# Patient Record
Sex: Female | Born: 1990
Health system: Southern US, Community
[De-identification: ages and names within clinical notes are randomized; demographics above are authoritative.]

## PROBLEM LIST (undated history)

## (undated) DIAGNOSIS — R42 Dizziness and giddiness: Secondary | ICD-10-CM

## (undated) DIAGNOSIS — L732 Hidradenitis suppurativa: Secondary | ICD-10-CM

## (undated) DIAGNOSIS — K5909 Other constipation: Secondary | ICD-10-CM

## (undated) DIAGNOSIS — S0990XA Unspecified injury of head, initial encounter: Secondary | ICD-10-CM

## (undated) DIAGNOSIS — B009 Herpesviral infection, unspecified: Secondary | ICD-10-CM

## (undated) HISTORY — DX: Unspecified injury of head, initial encounter: S09.90XA

## (undated) HISTORY — DX: Other constipation: K59.09

## (undated) HISTORY — DX: Dizziness and giddiness: R42

---
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
## 2009-06-11 ENCOUNTER — Ambulatory Visit: Payer: Self-pay | Admitting: Internal Medicine

## 2009-06-11 DIAGNOSIS — R42 Dizziness and giddiness: Secondary | ICD-10-CM

## 2009-06-11 HISTORY — DX: Dizziness and giddiness: R42

## 2009-06-15 ENCOUNTER — Ambulatory Visit (HOSPITAL_COMMUNITY): Admission: RE | Admit: 2009-06-15 | Discharge: 2009-06-15 | Payer: Self-pay | Admitting: Internal Medicine

## 2009-06-15 ENCOUNTER — Ambulatory Visit: Payer: Self-pay

## 2009-06-15 ENCOUNTER — Ambulatory Visit: Payer: Self-pay | Admitting: Cardiology

## 2009-08-14 HISTORY — PX: TONSILLECTOMY: SUR1361

## 2009-08-19 ENCOUNTER — Emergency Department (HOSPITAL_COMMUNITY): Admission: EM | Admit: 2009-08-19 | Discharge: 2009-08-19 | Payer: Self-pay | Admitting: Family Medicine

## 2009-08-21 ENCOUNTER — Ambulatory Visit: Payer: Self-pay | Admitting: Internal Medicine

## 2009-08-21 DIAGNOSIS — R21 Rash and other nonspecific skin eruption: Secondary | ICD-10-CM

## 2009-08-25 ENCOUNTER — Encounter: Payer: Self-pay | Admitting: Internal Medicine

## 2009-09-28 ENCOUNTER — Ambulatory Visit: Payer: Self-pay | Admitting: Internal Medicine

## 2010-03-14 LAB — CONVERTED CEMR LAB
ALT: 15 units/L (ref 0–35)
AST: 23 units/L (ref 0–37)
Albumin: 4.4 g/dL (ref 3.5–5.2)
BUN: 6 mg/dL (ref 6–23)
Basophils Absolute: 0.1 10*3/uL (ref 0.0–0.1)
Bilirubin, Direct: 0.2 mg/dL (ref 0.0–0.3)
Chlamydia, Swab/Urine, PCR: NEGATIVE
Cholesterol: 162 mg/dL (ref 0–200)
Creatinine, Ser: 0.7 mg/dL (ref 0.4–1.2)
Eosinophils Absolute: 0.1 10*3/uL (ref 0.0–0.7)
GC Probe Amp, Urine: NEGATIVE
GFR calc non Af Amer: 137.94 mL/min (ref >60)
Glucose, Bld: 80 mg/dL (ref 70–99)
HCT: 40.1 % (ref 36.0–46.0)
Hemoglobin: 13.5 g/dL (ref 12.0–15.0)
LDL Cholesterol: 105 mg/dL — ABNORMAL HIGH (ref 0–99)
Neutro Abs: 3.7 10*3/uL (ref 1.4–7.7)
Nitrite: NEGATIVE
Specific Gravity, Urine: >=1.03 (ref 1.000–1.030)
TSH: 2.97 microintl units/mL (ref 0.35–5.50)
Total Bilirubin: 1.2 mg/dL (ref 0.3–1.2)
Total CHOL/HDL Ratio: 3
Triglycerides: 45 mg/dL (ref 0.0–149.0)
Urobilinogen, UA: 0.2 (ref 0.0–1.0)

## 2010-03-18 NOTE — Op Note (Signed)
Summary: [facility]   Imported By: Sherian Rein 09/08/2009 07:42:34  _____________________________________________________________________  External Attachment:    Type:   Image     Comment:   External Document

## 2010-03-18 NOTE — Letter (Signed)
Summary: [facility]mission Note/[facility]  [facility]    Imported By: Sherian Rein 09/08/2009 07:43:45  _____________________________________________________________________  External Attachment:    Type:   Image     Comment:   External Document

## 2010-03-18 NOTE — Assessment & Plan Note (Signed)
Summary: LIGHTHEADED/#/CD   Vital Signs:  Patient profile:   20 year old female Height:      73 inches Weight:      151.50 pounds BMI:     20.06 O2 Sat:      98 % on Room air Temp:     98.1 degrees F oral Pulse rate:   75 / minute BP sitting:   100 / 62  (left arm) Cuff size:   regular  Vitals Entered ByZella Ball Ewing (June 11, 2009 11:38 AM)  O2 Flow:  Room air  CC: Sarah Randolph for several months/RE   Primary Care Provider:  Corwin Levins MD  CC:  Sarah Randolph for several months/RE.  History of Present Illness: overall doing well, except for recurrent near daily postural lightheadedness , only occurs with getting up quickly from sitting, or raising up quickly from bending at the waist, recurring since aug 2010.  Pt denies CP, sob, doe, wheezing, orthopnea, pnd, worsening LE edema, palps, dizziness or syncope  Pt denies new neuro symptoms such as headache, facial or extremity weakness   No syncope.  Problems Prior to Update: 1)  Sexually Transmitted Disease, Exposure To  (ICD-V01.6) 2)  Dizziness  (ICD-780.4) 3)  Head Trauma, Mild  (ICD-959.01) 4)  Preventive Health Care  (ICD-V70.0)  Medications Prior to Update: 1)  Ibuprofen 200 Mg Tabs (Ibuprofen) .... Take 1-2 By Mouth Once Daily As Needed  Current Medications (verified): 1)  Ibuprofen 200 Mg Tabs (Ibuprofen) .... 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         all otherwise negative per pt -    Physical Exam  General:  alert and well-developed.   Head:  normocephalic and atraumatic.   Eyes:  vision grossly intact, pupils equal, and pupils round.   Ears:  R ear normal and L ear normal.   Nose:  no external deformity and no nasal discharge.   Mouth:  no gingival abnormalities and pharynx pink and moist.   Neck:  supple and no masses.   Lungs:  normal respiratory effort and normal breath sounds.   Heart:  normal rate and regular rhythm.   Abdomen:  soft, non-tender, and normal bowel sounds.   Msk:  no joint tenderness and no joint swelling.   Extremities:  no edema, no erythema  Neurologic:  cranial nerves II-XII intact, strength normal in all extremities, sensation intact to light touch, gait normal, and DTRs symmetrical and normal.   Skin:  color normal and no rashes.   Psych:  not anxious appearing and not depressed appearing.     Impression & Recommendations:  Problem # 1:  PREVENTIVE HEALTH CARE (ICD-V70.0)  Overall doing well, age appropriate education and counseling updated and referral  for appropriate preventive services done unless declined, immunizations up to date or declined, diet counseling done if overweight, urged to quit smoking if smokes , most recent labs reviewed and current ordered if appropriate, ecg reviewed or declined (interpretation per ECG scanned in the EMR if done); information regarding Medicare Prevention requirements given if appropriate   Orders: TLB-BMP (Basic Metabolic Panel-BMET)  (80048-METABOL) TLB-CBC Platelet - w/Differential (85025-CBCD) TLB-Hepatic/Liver Function Pnl (80076-HEPATIC) TLB-Lipid Panel (80061-LIPID) TLB-TSH (Thyroid Stimulating Hormone) (84443-TSH) TLB-Udip ONLY (81003-UDIP)  Problem # 2:  DIZZINESS (ICD-780.4) postura without syncope, but recurrent, no palps or CP,  ECG reviewed - no prolonged QT or other;  will check Echo  Orders: EKG w/ Interpretation (93000) Echo Referral (Echo)  Problem # 3:  SEXUALLY TRANSMITTED DISEASE, EXPOSURE TO (ICD-V01.6) pt requests std eval - denies unprotected intercourse or pelvic pain, d/c  Complete Medication List: 1)  Ibuprofen 200 Mg Tabs (Ibuprofen) .... Take 1-2 by mouth once daily as needed 2)  Depoprovera   Other Orders: T-HIV-1 (Screen) [id number]) T-RPR (Syphilis) [id number]) T-Chlamydia & GC Probe, Urine (87491/[id number]) T-Herpes Simplex Type 2 (95621-30865)  Patient Instructions: 1)  Please go to the Lab in the basement for your blood and/or urine tests today  2)  Your EKG was good today 3)  You will be contacted about the referral(s) to: Echocardiogram 4)  Please schedule a follow-up appointment in 1 year or sooner if needed

## 2010-03-18 NOTE — Assessment & Plan Note (Signed)
Summary: f/u appt/#/cd   Vital Signs:  Patient profile:   20 year old female Height:      74 inches Weight:      140.25 pounds BMI:     18.07 O2 Sat:      97 % on Room air Temp:     97.6 degrees F oral Pulse rate:   76 / minute BP sitting:   100 / 60  (left arm) Cuff size:   regular  Vitals Entered By: Zella Ball Ewing CMA Duncan Dull) (September 28, 2009 9:44 AM)  O2 Flow:  Room air CC: followup/Re   Primary Care Antwan Bribiesca:  Corwin Levins MD  CC:  followup/Re.  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  Pt denies CP, sob, doe, wheezing, orthopnea, pnd, worsening LE edema, palps, dizziness or syncope  Pt denies new neuro symptoms such as headache, facial or extremity weakness   No fever, wt loss, night sweats, loss of appetite or other constitutional symptoms  Problems Prior to Update: 1)  Rash-nonvesicular  (ICD-782.1) 2)  Sexually Transmitted Disease, Exposure To  (ICD-V01.6) 3)  Dizziness  (ICD-780.4) 4)  Head Trauma, Mild  (ICD-959.01) 5)  Preventive Health Care  (ICD-V70.0)  Medications Prior to Update: 1)  Depoprovera 2)  Amoxicillin 250 Mg Caps (Amoxicillin) .Marland Kitchen.. 1 Cap Two Times A Day 3)  Lotrisone 1-0.05 % Crea (Clotrimazole-Betamethasone) .... Use Asd Two Times A Day As Needed  Current Medications (verified): 1)  Depo-Provera 150 Mg/ml Susp (Medroxyprogesterone Acetate) .... 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   Head:  normocephalic and atraumatic.   Eyes:  vision grossly intact, pupils equal, and pupils round.   Ears:  R ear normal and L ear normal.   Nose:  no external deformity and no nasal discharge.   Mouth:  no gingival abnormalities and pharynx pink and moist.   Neck:  supple and no masses.   Lungs:  normal respiratory effort and normal breath sounds.   Heart:  normal rate and regular rhythm.   Abdomen:  soft, non-tender, and normal bowel sounds.   Msk:  no joint tenderness and no joint swelling.   Extremities:  no edema, no erythema  Neurologic:  cranial nerves II-XII intact and strength normal in all extremities.   Skin:  color normal and no rashes.     Impression & Recommendations:  Problem # 1:  RASH-NONVESICULAR (ICD-782.1)  The following medications were removed from the medication list:    Lotrisone 1-0.05 % Crea (Clotrimazole-betamethasone) ..... Use asd two times a day as needed resolved, ok to follow  also, physical form filled out, no charge  Problem # 2:  UNSPECIFIED CONTRACEPTIVE MANAGEMENT (ICD-V25.9) ok for depoprovera 150 mg  - next due in Nov 2011,  then feb 2012, then plans to see GYN when she returns in May 2012 for first pap  Complete Medication List: 1)  Depo-provera 150 Mg/ml Susp (Medroxyprogesterone acetate) .... Use asd im x 1 dose - Mar 31, 2010  Patient Instructions: 1)  Your form was filled out today 2)  You are given the prescriptoin today for the depoprovera for November and February 3)  Please make appt with your GYN in May 2012 for Depo shot and Pap smear 4)  Please schedule a follow-up appointment as needed. Prescriptions: DEPO-PROVERA 150 MG/ML SUSP (MEDROXYPROGESTERONE ACETATE) use asd IM x 1 dose - Mar 31, 2010  #1 x 0   Entered and Authorized by:    Corwin Levins MD   Signed by:   Corwin Levins MD on 09/28/2009   Method used:   Print then Give to Patient   RxID:   [id number] DEPO-PROVERA 150 MG/ML SUSP (MEDROXYPROGESTERONE ACETATE) use asd IM x 1 dose - November15, 2011  #1 x 0   Entered and Authorized by:   Corwin Levins MD   Signed by:   Corwin Levins MD on 09/28/2009   Method used:   Print then Give to Patient   RxID:   [id number]

## 2010-03-18 NOTE — Assessment & Plan Note (Signed)
Summary: BITTEN BY SOMETHING--STC   Vital Signs:  Patient profile:   20 year old female Height:      73 inches Weight:      145 pounds BMI:     19.20 O2 Sat:      99 % on Room air Temp:     98.1 degrees F oral Pulse rate:   63 / minute BP sitting:   102 / 68  (left arm) Cuff size:   regular  Vitals Entered By: Bill Salinas CMA (August 21, 2009 11:52 AM)  O2 Flow:  Room air CC: pt here with c/o some sort of bite on her chest and right shoulder, pt was seen at [facility] and given amox and fluticasone cream but states bites are getting redder and itch quite a bit/ ab   Primary Care Provider:  Corwin Levins MD  CC:  pt here with c/o some sort of bite on her chest and right shoulder and pt was seen at [facility] and given amox and fluticasone cream but states bites are getting redder and itch quite a bit/ ab.  History of Present Illness: here with acute onset rash onset july 5, mild but persistent pruritic and not getting better with amoxil and fluticason cream per urgent care';  has possibly increased spots and more itchy to left upper chest, and 2 small areas right lateral upper arm;  no pain, fever , other rash, tongue swelling or wheezing.    Problems Prior to Update: 1)  Rash-nonvesicular  (ICD-782.1) 2)  Sexually Transmitted Disease, Exposure To  (ICD-V01.6) 3)  Dizziness  (ICD-780.4) 4)  Head Trauma, Mild  (ICD-959.01) 5)  Preventive Health Care  (ICD-V70.0)  Medications Prior to Update: 1)  Ibuprofen 200 Mg Tabs (Ibuprofen) .... Take 1-2 By Mouth Once Daily As Needed 2)  Depoprovera  Current Medications (verified): 1)  Depoprovera 2)  Amoxicillin 250 Mg Caps (Amoxicillin) .Marland Kitchen.. 1 Cap Two Times A Day 3)  Lotrisone 1-0.05 % Crea (Clotrimazole-Betamethasone) .... 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   Head:  normocephalic and atraumatic.   Eyes:  vision grossly intact, pupils equal, and pupils round.   Ears:  R ear normal and L ear normal.   Nose:  no external deformity and no nasal discharge.   Mouth:  no gingival abnormalities and pharynx pink and moist.   Neck:  supple and no masses.   Lungs:  normal respiratory effort and normal breath sounds.   Heart:  normal rate and regular rhythm.   Extremities:  no edema, no erythema  Skin:  1 cm area to left upper chest, and 2 similar to right upper arm erythem spotty lesions nontender nonvesicular   Impression & Recommendations:  Problem # 1:  RASH-NONVESICULAR (ICD-782.1)  Her updated medication list for this problem includes:    Lotrisone 1-0.05 % Crea (Clotrimazole-betamethasone) ..... Use asd two times a day as needed treat as above, f/u any worsening signs or symptoms   Complete Medication List: 1)  Depoprovera  2)  Amoxicillin 250 Mg Caps (Amoxicillin) .Marland KitchenMarland KitchenMarland Kitchen 1  cap two times a day 3)  Lotrisone 1-0.05 % Crea (Clotrimazole-betamethasone) .... Use asd two times a day as needed  Patient Instructions: 1)  stop the fluticasone cream 2)  Please take all new medications as prescribed - the new cream 3)  Continue all previous medications as before this visit 4)  You can also use Caladryl OTC for itching 5)  Good Luck with your tonsils out next wk 6)  Please schedule a follow-up appointment as needed. Prescriptions: LOTRISONE 1-0.05 % CREA (CLOTRIMAZOLE-BETAMETHASONE) use asd two times a day as needed  #1 x 0   Entered and Authorized by:   Corwin Levins MD   Signed by:   Corwin Levins MD on 08/21/2009   Method used:   Print then Give to Patient   RxID:   [id number]

## 2010-03-18 NOTE — Letter (Signed)
[facility] [address] [facility] [address] [facility] [address] [facility] [place]   Imported By: Lester Tolland 09/29/2009 10:48:12  _____________________________________________________________________  External Attachment:    Type:   Image     Comment:   External Document

## 2010-05-06 ENCOUNTER — Ambulatory Visit (INDEPENDENT_AMBULATORY_CARE_PROVIDER_SITE_OTHER): Payer: 59 | Admitting: Endocrinology

## 2010-05-06 ENCOUNTER — Encounter: Payer: Self-pay | Admitting: Endocrinology

## 2010-05-06 VITALS — BP 104/70 | HR 80 | Temp 98.1°F | Ht 73.0 in | Wt 153.6 lb

## 2010-05-06 DIAGNOSIS — J069 Acute upper respiratory infection, unspecified: Secondary | ICD-10-CM | POA: Insufficient documentation

## 2010-05-06 MED ORDER — CEFUROXIME AXETIL 250 MG PO TABS: 250.0000 mg | ORAL_TABLET | Freq: Two times a day (BID) | ORAL | Status: AC

## 2010-05-06 NOTE — Progress Notes (Signed)
  Subjective:    Patient ID: Sarah Randolph, female    DOB: April 18, 1990, 20 y.o.   MRN: [id number]  HPI Pt states few days of congestion in the nose, and assoc sore throat. No earache.  No cough.  lmp was 3 mos ago.  She stopped depo-provera 7 months ago.  She is not sexually active.   Past Medical History  Diagnosis Date  . HEAD TRAUMA, MILD 06/10/2008  . DIZZINESS 06/11/2009  . Chronic constipation    Past Surgical History  Procedure Date  . Tonsillectomy 08/2009    Dr. Edd Fabian    reports that she has never smoked. She does not have any smokeless tobacco history on file. Her alcohol and drug histories not on file. family history includes Hypertension in her other. No Known Allergies   Review of Systems Denies fever and wheezing.      Objective:   Physical Exam Gen: no distress Tm's: are red bilaterally Chest: clear to auscultation.  no respiratory distress head: no deformity eyes: no periorbital swelling, no proptosis external nose and ears are normal mouth: no lesion seen       Assessment & Plan:  Glenford Peers, new problem

## 2010-05-06 NOTE — Patient Instructions (Signed)
i sent a prescription for an antibiotic to your pharmacy. Take loratadine-d (non-prescription) prn for congestion.

## 2010-05-24 LAB — RAPID STREP SCREEN (MED CTR MEBANE ONLY): Streptococcus, Group A Screen (Direct): NEGATIVE

## 2010-06-01 LAB — POCT RAPID STREP A (OFFICE): Streptococcus, Group A Screen (Direct): NEGATIVE

## 2010-09-30 ENCOUNTER — Other Ambulatory Visit: Payer: 59

## 2010-09-30 ENCOUNTER — Telehealth: Payer: Self-pay | Admitting: Internal Medicine

## 2010-09-30 ENCOUNTER — Other Ambulatory Visit (INDEPENDENT_AMBULATORY_CARE_PROVIDER_SITE_OTHER): Payer: 59

## 2010-09-30 DIAGNOSIS — Z0000 Encounter for general adult medical examination without abnormal findings: Secondary | ICD-10-CM

## 2010-09-30 DIAGNOSIS — Z0001 Encounter for general adult medical examination with abnormal findings: Secondary | ICD-10-CM | POA: Insufficient documentation

## 2010-09-30 LAB — CBC WITH DIFFERENTIAL/PLATELET
Eosinophils Absolute: 0.1 10*3/uL (ref 0.0–0.7)
Hemoglobin: 12.8 g/dL (ref 12.0–15.0)
Lymphocytes Relative: 43.1 % (ref 12.0–46.0)
Lymphs Abs: 3.2 10*3/uL (ref 0.7–4.0)
MCHC: 32.8 g/dL (ref 30.0–36.0)
Monocytes Relative: 4.7 % (ref 3.0–12.0)
Neutrophils Relative %: 49.8 % (ref 43.0–77.0)
RBC: 4.92 Mil/uL (ref 3.87–5.11)
RDW: 15.8 % — ABNORMAL HIGH (ref 11.5–14.6)
WBC: 7.4 10*3/uL (ref 4.5–10.5)

## 2010-09-30 LAB — URINALYSIS, ROUTINE W REFLEX MICROSCOPIC
Leukocytes, UA: NEGATIVE
Nitrite: NEGATIVE
Specific Gravity, Urine: >=1.03 (ref 1.000–1.030)
Urine Glucose: NEGATIVE
Urobilinogen, UA: 0.2 (ref 0.0–1.0)
pH: 6 (ref 5.0–8.0)

## 2010-09-30 LAB — BASIC METABOLIC PANEL
Chloride: 102 mEq/L (ref 96–112)
Creatinine, Ser: 0.6 mg/dL (ref 0.4–1.2)
Glucose, Bld: 79 mg/dL (ref 70–99)
Potassium: 3.3 mEq/L — ABNORMAL LOW (ref 3.5–5.1)

## 2010-09-30 LAB — HEPATIC FUNCTION PANEL: AST: 20 U/L (ref 0–37)

## 2010-09-30 LAB — LIPID PANEL
Cholesterol: 153 mg/dL (ref 0–200)
HDL: 60 mg/dL (ref >39.00)
LDL Cholesterol: 86 mg/dL (ref 0–99)
Total CHOL/HDL Ratio: 3

## 2010-09-30 NOTE — Telephone Encounter (Signed)
Message copied by Corwin Levins on Thu Sep 30, 2010 12:36 PM ------      Message from: Scharlene Gloss B      Created: Thu Sep 30, 2010 11:43 AM      Regarding: labs       Labs need an order put in for CPX labs for this patient, has appt. 10/06/2010

## 2010-09-30 NOTE — Telephone Encounter (Signed)
Done per emr 

## 2010-10-06 ENCOUNTER — Other Ambulatory Visit (HOSPITAL_COMMUNITY): Payer: Self-pay | Admitting: Internal Medicine

## 2010-10-06 ENCOUNTER — Ambulatory Visit (INDEPENDENT_AMBULATORY_CARE_PROVIDER_SITE_OTHER): Payer: 59 | Admitting: Internal Medicine

## 2010-10-06 ENCOUNTER — Other Ambulatory Visit (HOSPITAL_COMMUNITY): Payer: 59

## 2010-10-06 ENCOUNTER — Ambulatory Visit (HOSPITAL_COMMUNITY): Payer: 59 | Attending: Internal Medicine | Admitting: Radiology

## 2010-10-06 ENCOUNTER — Encounter: Payer: Self-pay | Admitting: Internal Medicine

## 2010-10-06 VITALS — BP 110/70 | HR 70 | Temp 98.1°F | Ht 74.0 in | Wt 148.0 lb

## 2010-10-06 DIAGNOSIS — R55 Syncope and collapse: Secondary | ICD-10-CM

## 2010-10-06 DIAGNOSIS — I379 Nonrheumatic pulmonary valve disorder, unspecified: Secondary | ICD-10-CM | POA: Insufficient documentation

## 2010-10-06 DIAGNOSIS — I079 Rheumatic tricuspid valve disease, unspecified: Secondary | ICD-10-CM | POA: Insufficient documentation

## 2010-10-06 DIAGNOSIS — Z0000 Encounter for general adult medical examination without abnormal findings: Secondary | ICD-10-CM

## 2010-10-06 DIAGNOSIS — I359 Nonrheumatic aortic valve disorder, unspecified: Secondary | ICD-10-CM | POA: Insufficient documentation

## 2010-10-06 DIAGNOSIS — R42 Dizziness and giddiness: Secondary | ICD-10-CM | POA: Insufficient documentation

## 2010-10-06 NOTE — Assessment & Plan Note (Signed)

## 2010-10-06 NOTE — Assessment & Plan Note (Signed)
By hx prob vasovagal, ecg reviewed - sinus with IRBBB, no acute changes, no prolonged QT; will ask for "stat" echo however given her hx of mult syncope and plays basketball on scholarship; consider card eval but pt declines for now

## 2010-10-06 NOTE — Patient Instructions (Addendum)
Continue all other medications as before Your form is filled out today You will be contacted regarding the referral for: Echocardiogram Please call the phone number [phone] (the PhoneTree System) for results of testing in 2-3 days;  When calling, simply dial the number, and when prompted enter the MRN number above (the Medical Record Number) and the # key, then the message should start.

## 2010-10-06 NOTE — Progress Notes (Signed)
Subjective:    Patient ID: Sarah Randolph, female    DOB: 10-15-1990, 20 y.o.   MRN: [id number]  HPI Here for wellness and f/u;  Overall doing ok;  Pt denies CP, worsening SOB, DOE, wheezing, orthopnea, PND, worsening LE edema.  Pt denies neurological change such as new Headache, facial or extremity weakness.  Pt denies polydipsia, polyuria, or low sugar symptoms. Pt states overall good compliance with treatment and medications, good tolerability, and trying to follow lower cholesterol diet.  Pt denies worsening depressive symptoms, suicidal ideation or panic. No fever, wt loss, night sweats, loss of appetite, or other constitutional symptoms.  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 No other prodromal symtpoms, and Pt denies chest pain, increased sob or doe, wheezing, orthopnea, PND, increased LE swelling, palpitations.    Pt denies fever, wt loss, night sweats, loss of appetite, or other constitutional symptoms No recent overt or unusual bruising or bleeding.  States have passed out in similar fashion in the past, the last time over 1 yr, and total episodes of syncope have been at least 6-10 by her count.     Past Medical History  Diagnosis Date  .  HEAD TRAUMA, MILD 06/10/2008  . DIZZINESS 06/11/2009  . Chronic constipation    Past Surgical History  Procedure Date  . Tonsillectomy 08/2009    Dr. Edd Fabian    reports that she has never smoked. She does not have any smokeless tobacco history on file. Her alcohol and drug histories not on file. family history includes Hypertension in her other. No Known Allergies No current outpatient prescriptions on file prior to visit.    Review of Systems Review of Systems  Constitutional: Negative for diaphoresis and unexpected weight change.  HENT: Negative for drooling and tinnitus.   Eyes: Negative for photophobia and visual disturbance.  Respiratory: Negative for choking and stridor.   Gastrointestinal: Negative for vomiting and blood in stool.  Genitourinary: Negative for hematuria and decreased urine volume.  Musculoskeletal: Negative for gait problem.  Skin: Negative for color change and wound.  Neurological: Negative for tremors and numbness.  Psychiatric/Behavioral: Negative for decreased concentration. The patient is not hyperactive.       Objective:   Physical Exam BP 110/70  Pulse 70  Temp(Src) 98.1 F (36.7 C) (Oral)  Ht 6\' 2"  (1.88 m)  Wt 148 lb (67.132 kg)  BMI 19.00 kg/m2  SpO2 97% Physical Exam  VS noted Constitutional: Pt appears well-developed and well-nourished.  HENT: Head: Normocephalic.  Right Ear: External ear normal.  Left Ear: External ear normal.  Eyes: Conjunctivae and EOM are normal. Pupils are equal, round, and reactive to light.  Neck:  Normal range of motion. Neck supple.  Cardiovascular: Normal rate and regular rhythm.   Pulmonary/Chest: Effort normal and breath sounds normal.  Abd:  Soft, NT, non-distended, + BS Neurological: Pt is alert. No cranial nerve deficit. motor/sens/dtr/gait normal Skin: Skin is warm. No erythema.  Psychiatric: Pt behavior is normal. Thought content normal.     Assessment & Plan:

## 2010-10-07 ENCOUNTER — Other Ambulatory Visit (HOSPITAL_COMMUNITY): Payer: 59

## 2010-11-08 ENCOUNTER — Ambulatory Visit: Payer: 59 | Admitting: Internal Medicine

## 2010-11-08 DIAGNOSIS — Z0289 Encounter for other administrative examinations: Secondary | ICD-10-CM

## 2011-08-11 ENCOUNTER — Encounter: Payer: Self-pay | Admitting: Internal Medicine

## 2011-08-11 ENCOUNTER — Ambulatory Visit (INDEPENDENT_AMBULATORY_CARE_PROVIDER_SITE_OTHER)
Admission: RE | Admit: 2011-08-11 | Discharge: 2011-08-11 | Disposition: A | Discharge status: Home / Self Care | Payer: 59 | Source: Ambulatory Visit | Attending: Internal Medicine | Admitting: Internal Medicine

## 2011-08-11 ENCOUNTER — Other Ambulatory Visit (INDEPENDENT_AMBULATORY_CARE_PROVIDER_SITE_OTHER): Payer: 59

## 2011-08-11 ENCOUNTER — Ambulatory Visit (INDEPENDENT_AMBULATORY_CARE_PROVIDER_SITE_OTHER): Payer: 59 | Admitting: Internal Medicine

## 2011-08-11 VITALS — BP 94/60 | HR 59 | Temp 98.4°F | Ht 73.0 in | Wt 162.0 lb

## 2011-08-11 DIAGNOSIS — R2230 Localized swelling, mass and lump, unspecified upper limb: Secondary | ICD-10-CM | POA: Insufficient documentation

## 2011-08-11 DIAGNOSIS — Z0000 Encounter for general adult medical examination without abnormal findings: Secondary | ICD-10-CM

## 2011-08-11 DIAGNOSIS — R229 Localized swelling, mass and lump, unspecified: Secondary | ICD-10-CM

## 2011-08-11 LAB — LIPID PANEL
HDL: 69.1 mg/dL (ref >39.00)
Total CHOL/HDL Ratio: 3

## 2011-08-11 LAB — BASIC METABOLIC PANEL
BUN: 13 mg/dL (ref 6–23)
CO2: 28 mEq/L (ref 19–32)
Calcium: 9.5 mg/dL (ref 8.4–10.5)
Chloride: 104 mEq/L (ref 96–112)
Creatinine, Ser: 0.7 mg/dL (ref 0.4–1.2)
GFR: 126.63 mL/min (ref >60.00)
Glucose, Bld: 88 mg/dL (ref 70–99)
Sodium: 139 mEq/L (ref 135–145)

## 2011-08-11 LAB — URINALYSIS, ROUTINE W REFLEX MICROSCOPIC
Bilirubin Urine: NEGATIVE
Ketones, ur: NEGATIVE
Nitrite: NEGATIVE
Specific Gravity, Urine: 1.015 (ref 1.000–1.030)
Total Protein, Urine: NEGATIVE
pH: 7 (ref 5.0–8.0)

## 2011-08-11 LAB — CBC WITH DIFFERENTIAL/PLATELET
Basophils Relative: 0.8 % (ref 0.0–3.0)
Eosinophils Relative: 2.9 % (ref 0.0–5.0)
HCT: 41.2 % (ref 36.0–46.0)
Hemoglobin: 13.4 g/dL (ref 12.0–15.0)
Lymphs Abs: 2.5 10*3/uL (ref 0.7–4.0)
MCV: 80.5 fl (ref 78.0–100.0)
Monocytes Absolute: 0.5 10*3/uL (ref 0.1–1.0)
RBC: 5.12 Mil/uL — ABNORMAL HIGH (ref 3.87–5.11)
WBC: 7.1 10*3/uL (ref 4.5–10.5)

## 2011-08-11 LAB — HEPATIC FUNCTION PANEL: Total Bilirubin: 0.9 mg/dL (ref 0.3–1.2)

## 2011-08-11 MED ORDER — AZITHROMYCIN 250 MG PO TABS: ORAL_TABLET | ORAL | Status: AC

## 2011-08-11 NOTE — Patient Instructions (Addendum)
Take all new medications as prescribed  - the antibiotic Please go to XRAY in the Basement for the x-ray test Please go to LAB in the Basement for the blood and/or urine tests to be done today You will be contacted by phone if any changes need to be made immediately.  Otherwise, you will receive a letter about your results with an explanation. Please return if the lump becomes larger Your form was filled out today Please return in 1 year for your yearly visit, or as needed

## 2011-08-12 ENCOUNTER — Encounter: Payer: Self-pay | Admitting: Internal Medicine

## 2011-08-13 ENCOUNTER — Encounter: Payer: Self-pay | Admitting: Internal Medicine

## 2011-08-13 NOTE — Assessment & Plan Note (Signed)

## 2011-08-13 NOTE — Assessment & Plan Note (Addendum)
Unclear etiology, ? Infect cyst vs LA - for antibx course,  to f/u any worsening symptoms or concerns, for cxr but suspect localized issue, consider soft tissue u/s if persists or worsens

## 2011-08-13 NOTE — Progress Notes (Signed)
Subjective:    Patient ID: Sarah Randolph, female    DOB: 1990-12-15, 21 y.o.   MRN: [id number]  HPI  Here for wellness and f/u;  Overall doing ok;  Pt denies CP, worsening SOB, DOE, wheezing, orthopnea, PND, worsening LE edema, palpitations, dizziness or syncope.  Pt denies neurological change such as new Headache, facial or extremity weakness.  Pt denies polydipsia, polyuria, or low sugar symptoms. Pt states overall good compliance with treatment and medications, good tolerability, and trying to follow lower cholesterol diet.  Pt denies worsening depressive symptoms, suicidal ideation or panic. No fever, wt loss, night sweats, loss of appetite, or other constitutional symptoms.  Pt states good ability with ADL's, low fall risk, home safety reviewed and adequate, no significant changes in hearing or vision, and occasionally active with exercise.  Does have a lump to left axilla, mild tender, red, swelling with increased warmth and sweats in the last 3 days. Past Medical History  Diagnosis Date  . HEAD TRAUMA, MILD 06/10/2008  . DIZZINESS 06/11/2009  . Chronic constipation    Past Surgical History  Procedure Date  . Tonsillectomy 08/2009    Dr. Edd Fabian    reports that she has never smoked. She does not have any smokeless tobacco history on file. Her alcohol and drug histories not on file. family history includes Hypertension in her other. No Known Allergies No current outpatient prescriptions on file prior to visit.   Review of Systems Review of Systems  Constitutional: Negative for diaphoresis, activity change, appetite change and unexpected weight change.  HENT: Negative for hearing loss, ear pain, facial swelling, mouth sores and neck stiffness.   Eyes: Negative for pain, redness and visual disturbance.  Respiratory: Negative for shortness of breath and wheezing.   Cardiovascular: Negative for chest pain and palpitations.  Gastrointestinal: Negative for diarrhea, blood in stool,  abdominal distention and rectal pain.  Genitourinary: Negative for hematuria, flank pain and decreased urine volume.  Musculoskeletal: Negative for myalgias and joint swelling.  Skin: Negative for color change and wound.  Neurological: Negative for syncope and numbness.  Hematological: Negative for adenopathy.  Psychiatric/Behavioral: Negative for hallucinations, self-injury, decreased concentration and agitation.     Objective:   Physical Exam BP 94/60  Pulse 59  Temp 98.4 F (36.9 C) (Oral)  Ht 6\' 1"  (1.854 m)  Wt 162 lb (73.483 kg)  BMI 21.37 kg/m2  SpO2 98%  LMP 08/11/2011 Physical Exam  VS noted Constitutional: Pt is oriented to person, place, and time. Appears well-developed and well-nourished.  Head: Normocephalic and atraumatic.  Right Ear: External ear normal.  Left Ear: External ear normal.  Nose: Nose normal.  Mouth/Throat: Oropharynx is clear and moist.  Eyes: Conjunctivae and EOM are normal. Pupils are equal, round, and reactive to light.  Neck: Normal range of motion. Neck supple. No JVD present. No tracheal deviation present.  Cardiovascular: Normal rate, regular rhythm, normal heart sounds and intact distal pulses.   Pulmonary/Chest: Effort normal and breath sounds normal.  Abdominal: Soft. Bowel sounds are normal. There is no tenderness.  Musculoskeletal: Normal range of motion. Exhibits no edema.  Lymphadenopathy:  Has no cervical adenopathy.  Neurological: Pt is alert and oriented to person, place, and time. Pt has normal reflexes. No cranial nerve deficit.  Skin: Skin is warm and dry. No rash noted. Left axilla with approx < 1.0 cm small tender lump subq without fluctuance or drainage Psychiatric:  Has  normal mood and affect. Behavior is normal.  Assessment & Plan:

## 2011-10-11 ENCOUNTER — Encounter: Payer: 59 | Admitting: Internal Medicine

## 2012-10-02 ENCOUNTER — Emergency Department (HOSPITAL_BASED_OUTPATIENT_CLINIC_OR_DEPARTMENT_OTHER)
Admission: EM | Admit: 2012-10-02 | Discharge: 2012-10-02 | Disposition: A | Discharge status: Home / Self Care | Payer: Self-pay | Attending: Emergency Medicine | Admitting: Emergency Medicine

## 2012-10-02 ENCOUNTER — Encounter (HOSPITAL_BASED_OUTPATIENT_CLINIC_OR_DEPARTMENT_OTHER): Payer: Self-pay | Admitting: *Deleted

## 2012-10-02 DIAGNOSIS — M25569 Pain in unspecified knee: Secondary | ICD-10-CM | POA: Insufficient documentation

## 2012-10-02 DIAGNOSIS — M25469 Effusion, unspecified knee: Secondary | ICD-10-CM | POA: Insufficient documentation

## 2012-10-02 DIAGNOSIS — R52 Pain, unspecified: Secondary | ICD-10-CM | POA: Insufficient documentation

## 2012-10-02 DIAGNOSIS — Z8719 Personal history of other diseases of the digestive system: Secondary | ICD-10-CM | POA: Insufficient documentation

## 2012-10-02 DIAGNOSIS — M25562 Pain in left knee: Secondary | ICD-10-CM

## 2012-10-02 DIAGNOSIS — Z87828 Personal history of other (healed) physical injury and trauma: Secondary | ICD-10-CM | POA: Insufficient documentation

## 2012-10-02 NOTE — ED Notes (Signed)
About a month ago pt was playing basketball and her knee "gave out", since then it has been tender.

## 2012-10-02 NOTE — ED Provider Notes (Signed)
CSN: [id number]     Arrival date & time 10/02/12  1924 History     First MD Initiated Contact with Patient 10/02/12 1934     Chief Complaint  Patient presents with  . Knee Injury   (Consider location/radiation/quality/duration/timing/severity/associated sxs/prior Treatment) HPI Comments: Patient is a 22 year old female who presents today with left knee pain since one month ago. She was playing basketball and jumped up to block a shot and came down on her knee wrong. Since that time her knee has been intermittently swelling and she is developed a sharp pain on the medial aspect of her left knee as well as posteriorly. She currently has full range of motion and no difficulty walking and her knee. She has difficulty squatting down. She would like an MRI today. No fevers, chills, nausea, vomiting, abdominal pain, numbness, weakness, paresthesias.  The history is provided by the patient. No language interpreter was used.    Past Medical History  Diagnosis Date  . HEAD TRAUMA, MILD 06/10/2008  . DIZZINESS 06/11/2009  . Chronic constipation    Past Surgical History  Procedure Laterality Date  . Tonsillectomy  08/2009    Dr. Edd Fabian   Family History  Problem Relation Age of Onset  . Hypertension Other    History  Substance Use Topics  . Smoking status: Never Smoker   . Smokeless tobacco: Not on file  . Alcohol Use: Not on file   OB History   Grav Para Term Preterm Abortions TAB SAB Ect Mult Living                 Review of Systems  Constitutional: Negative for fever and chills.  Respiratory: Negative for shortness of breath.   Cardiovascular: Negative for chest pain.  Musculoskeletal: Positive for joint swelling and arthralgias.  All other systems reviewed and are negative.    Allergies  Review of patient's allergies indicates no known allergies.  Home Medications  No current outpatient prescriptions on file. BP 122/60  Pulse 68  Temp(Src) 98.5 F (36.9 C) (Oral)   Resp 16  Ht 6\' 2"  (1.88 m)  Wt 151 lb (68.493 kg)  BMI 19.38 kg/m2  SpO2 100% Physical Exam  Nursing note and vitals reviewed. Constitutional: She is oriented to person, place, and time. She appears well-developed and well-nourished. No distress.  HENT:  Head: Normocephalic and atraumatic.  Right Ear: External ear normal.  Left Ear: External ear normal.  Nose: Nose normal.  Mouth/Throat: Oropharynx is clear and moist.  Eyes: Conjunctivae are normal.  Neck: Normal range of motion.  Cardiovascular: Normal rate, regular rhythm, normal heart sounds, intact distal pulses and normal pulses.   Pulmonary/Chest: Effort normal and breath sounds normal. No stridor. No respiratory distress. She has no wheezes. She has no rales.  Abdominal: Soft. She exhibits no distension.  Musculoskeletal: Normal range of motion.  ttp on medial joint line and posteriorly on left knee. Joint stable. Full range of motion of left knee. Neurovascularly intact. Compartment soft.   Neurological: She is alert and oriented to person, place, and time. She has normal strength.  Skin: Skin is warm and dry. She is not diaphoretic. No erythema.  Psychiatric: She has a normal mood and affect. Her behavior is normal.    ED Course   Procedures (including critical care time)  Labs Reviewed - No data to display No results found. 1. Knee pain, acute, left     MDM  Patient presents with left knee pain since an injury one  month ago. She has been ambulatory since that time. Gait is not antalgic. There is a small amount of swelling at this time. The joint is stable. Neurovascularly intact. Compartments soft. She is given a knee sleeve for comfort. Follow up with ortho. Return instructions given. Vital signs stable for discharge. Patient / Family / Caregiver informed of clinical course, understand medical decision-making process, and agree with plan.   Mora Bellman, PA-C 10/02/12 [phone]

## 2012-10-03 NOTE — ED Provider Notes (Signed)
Medical screening examination/treatment/procedure(s) were performed by non-physician practitioner and as supervising physician I was immediately available for consultation/collaboration.   Gwyneth Sprout, MD 10/03/12 [phone]

## 2012-12-10 ENCOUNTER — Emergency Department (HOSPITAL_COMMUNITY): Payer: BC Managed Care – PPO

## 2012-12-10 ENCOUNTER — Encounter (HOSPITAL_COMMUNITY): Payer: Self-pay | Admitting: Emergency Medicine

## 2012-12-10 ENCOUNTER — Emergency Department (HOSPITAL_COMMUNITY)
Admission: EM | Admit: 2012-12-10 | Discharge: 2012-12-10 | Disposition: A | Discharge status: Home / Self Care | Payer: Self-pay | Attending: Emergency Medicine | Admitting: Emergency Medicine

## 2012-12-10 DIAGNOSIS — R1110 Vomiting, unspecified: Secondary | ICD-10-CM | POA: Insufficient documentation

## 2012-12-10 DIAGNOSIS — F1010 Alcohol abuse, uncomplicated: Secondary | ICD-10-CM | POA: Insufficient documentation

## 2012-12-10 DIAGNOSIS — Z87828 Personal history of other (healed) physical injury and trauma: Secondary | ICD-10-CM | POA: Insufficient documentation

## 2012-12-10 DIAGNOSIS — F10929 Alcohol use, unspecified with intoxication, unspecified: Secondary | ICD-10-CM

## 2012-12-10 DIAGNOSIS — Z8719 Personal history of other diseases of the digestive system: Secondary | ICD-10-CM | POA: Insufficient documentation

## 2012-12-10 DIAGNOSIS — Z3202 Encounter for pregnancy test, result negative: Secondary | ICD-10-CM | POA: Insufficient documentation

## 2012-12-10 DIAGNOSIS — R4182 Altered mental status, unspecified: Secondary | ICD-10-CM | POA: Insufficient documentation

## 2012-12-10 LAB — RAPID URINE DRUG SCREEN, HOSP PERFORMED
Amphetamines: NOT DETECTED
Benzodiazepines: NOT DETECTED

## 2012-12-10 LAB — POCT PREGNANCY, URINE: Preg Test, Ur: NEGATIVE

## 2012-12-10 LAB — ETHANOL: Alcohol, Ethyl (B): 257 mg/dL — ABNORMAL HIGH (ref 0–11)

## 2012-12-10 MED ORDER — SODIUM CHLORIDE 0.9 % IV BOLUS (SEPSIS)
1000.0000 mL | Freq: Once | INTRAVENOUS | Status: AC
  Administered 2012-12-10: 1000 mL via INTRAVENOUS

## 2012-12-10 MED ORDER — PANTOPRAZOLE SODIUM 40 MG IV SOLR
40.0000 mg | Freq: Once | INTRAVENOUS | Status: AC
  Administered 2012-12-10: 40 mg via INTRAVENOUS
  Filled 2012-12-10: qty 40

## 2012-12-10 MED ORDER — ONDANSETRON HCL 4 MG/2ML IJ SOLN
4.0000 mg | Freq: Once | INTRAMUSCULAR | Status: AC
  Administered 2012-12-10: 4 mg via INTRAVENOUS
  Filled 2012-12-10: qty 2
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
## 2012-12-13 ENCOUNTER — Ambulatory Visit: Payer: 59 | Admitting: Internal Medicine

## 2013-01-17 ENCOUNTER — Other Ambulatory Visit: Payer: BC Managed Care – PPO

## 2013-01-17 ENCOUNTER — Ambulatory Visit (INDEPENDENT_AMBULATORY_CARE_PROVIDER_SITE_OTHER): Payer: BC Managed Care – PPO | Admitting: Internal Medicine

## 2013-01-17 ENCOUNTER — Encounter: Payer: Self-pay | Admitting: Internal Medicine

## 2013-01-17 VITALS — BP 100/68 | HR 73 | Temp 98.6°F | Wt 154.1 lb

## 2013-01-17 DIAGNOSIS — R21 Rash and other nonspecific skin eruption: Secondary | ICD-10-CM

## 2013-01-17 DIAGNOSIS — L309 Dermatitis, unspecified: Secondary | ICD-10-CM

## 2013-01-17 DIAGNOSIS — L259 Unspecified contact dermatitis, unspecified cause: Secondary | ICD-10-CM

## 2013-01-17 MED ORDER — DIPHENHYDRAMINE HCL 25 MG PO TABS: 25.0000 mg | ORAL_TABLET | Freq: Four times a day (QID) | ORAL | Status: DC | PRN

## 2013-01-17 MED ORDER — TRIAMCINOLONE ACETONIDE 0.1 % EX LOTN: 1.0000 "application " | TOPICAL_LOTION | Freq: Two times a day (BID) | CUTANEOUS | Status: DC

## 2013-01-17 MED ORDER — METHYLPREDNISOLONE ACETATE 80 MG/ML IJ SUSP
80.0000 mg | Freq: Once | INTRAMUSCULAR | Status: AC
  Administered 2013-01-17: 80 mg via INTRAMUSCULAR

## 2013-01-17 NOTE — Patient Instructions (Addendum)
It was good to see you today.  We have reviewed your prior records including labs and tests today  Medrol 80mg  Steroid shot given to you for allergic reaction today  Prescription lotion to use on the affected skin as needed. Also over-the-counter Benadryl every 6 hours as needed for itch  Your prescription(s) have been submitted to your pharmacy. Please take as directed and contact our office if you believe you are having problem(s) with the medication(s).  Test(s) ordered today to check for food allergies. Your results will be released to MyChart (or called to you) after review, usually within 72hours after test completion. If any changes need to be made, you will be notified at that same time.   Eczema Atopic dermatitis, or eczema, is an inherited type of sensitive skin. Often people with eczema have a family history of allergies, asthma, or hay fever. It causes a red itchy rash and dry scaly skin. The itchiness may occur before the skin rash and may be very intense. It is not contagious. Eczema is generally worse during the cooler winter months and often improves with the warmth of summer. Eczema usually starts showing signs in infancy. Some children outgrow eczema, but it may last through adulthood. Flare-ups may be caused by:  Eating something or contact with something you are sensitive or allergic to.  Stress. DIAGNOSIS  The diagnosis of eczema is usually based upon symptoms and medical history. TREATMENT  Eczema cannot be cured, but symptoms usually can be controlled with treatment or avoidance of allergens (things to which you are sensitive or allergic to).  Controlling the itching and scratching.  Use over-the-counter antihistamines as directed for itching. It is especially useful at night when the itching tends to be worse.  Use over-the-counter steroid creams as directed for itching.  Scratching makes the rash and itching worse and may cause impetigo (a skin infection) if  fingernails are contaminated (dirty).  Keeping the skin well moisturized with creams every day. This will seal in moisture and help prevent dryness. Lotions containing alcohol and water can dry the skin and are not recommended.  Limiting exposure to allergens.  Recognizing situations that cause stress.  Developing a plan to manage stress. HOME CARE INSTRUCTIONS   Take prescription and over-the-counter medicines as directed by your caregiver.  Do not use anything on the skin without checking with your caregiver.  Keep baths or showers short (5 minutes) in warm (not hot) water. Use mild cleansers for bathing. You may add non-perfumed bath oil to the bath water. It is best to avoid soap and bubble bath.  Immediately after a bath or shower, when the skin is still damp, apply a moisturizing ointment to the entire body. This ointment should be a petroleum ointment. This will seal in moisture and help prevent dryness. The thicker the ointment the better. These should be unscented.  Keep fingernails cut short and wash hands often. If your child has eczema, it may be necessary to put soft gloves or mittens on your child at night.  Dress in clothes made of cotton or cotton blends. Dress lightly, as heat increases itching.  Avoid foods that may cause flare-ups. Common foods include cow's milk, peanut butter, eggs and wheat.  Keep a child with eczema away from anyone with fever blisters. The virus that causes fever blisters (herpes simplex) can cause a serious skin infection in children with eczema. SEEK MEDICAL CARE IF:   Itching interferes with sleep.  The rash gets worse or is  not better within one week following treatment.  The rash looks infected (pus or soft yellow scabs).  You or your child has an oral temperature above 102 F (38.9 C).  Your baby is older than 3 months with a rectal temperature of 100.5 F (38.1 C) or higher for more than 1 day.  The rash flares up after contact  with someone who has fever blisters. SEEK IMMEDIATE MEDICAL CARE IF:   Your baby is older than 3 months with a rectal temperature of 102 F (38.9 C) or higher.  Your baby is older than 3 months or younger with a rectal temperature of 100.4 F (38 C) or higher. Document Released: 01/29/2000 Document Revised: 04/25/2011 Document Reviewed: 09/03/2012 [facility] Patient Information 2014 [place], [place].

## 2013-01-17 NOTE — Progress Notes (Signed)
Pre-visit discussion using our clinic review tool. No additional management support is needed unless otherwise documented below in the visit note.  

## 2013-01-17 NOTE — Progress Notes (Signed)
   Subjective:    Patient ID: Sarah Randolph, female    DOB: [birth year], 22 y.o.   MRN: [id number]  Rash This is a new problem. The current episode started in the past 7 days. The problem has been waxing and waning since onset. The affected locations include the left shoulder, neck, back, right wrist, right hand and left wrist. The rash is characterized by dryness, itchiness and redness. She was exposed to a new detergent/soap and shellfish. Pertinent negatives include no congestion, cough, diarrhea, eye pain, fatigue, fever, joint pain, nail changes, rhinorrhea, shortness of breath, sore throat or vomiting. Past treatments include nothing. Her past medical history is significant for eczema. There is no history of allergies or asthma.   Past Medical History  Diagnosis Date  . HEAD TRAUMA, MILD 06/10/2008  . DIZZINESS 06/11/2009  . Chronic constipation     Review of Systems  Constitutional: Negative for fever and fatigue.  HENT: Negative for congestion, rhinorrhea and sore throat.   Eyes: Negative for pain.  Respiratory: Negative for cough and shortness of breath.   Gastrointestinal: Negative for vomiting and diarrhea.  Musculoskeletal: Negative for joint pain.  Skin: Positive for rash. Negative for nail changes.       Objective:   Physical Exam BP 100/68  Pulse 73  Temp(Src) 98.6 F (37 C) (Oral)  Wt 154 lb 1.9 oz (69.908 kg)  SpO2 99% Wt Readings from Last 3 Encounters:  01/17/13 154 lb 1.9 oz (69.908 kg)  10/02/12 151 lb (68.493 kg)  08/11/11 162 lb (73.483 kg)   Constitutional: She appears well-developed and well-nourished. No distress. friend at side Eyes: Conjunctivae and EOM are normal. Pupils are equal, round, and reactive to light. No scleral icterus.  Neck: Normal range of motion. Neck supple. No JVD present. No thyromegaly present.  Cardiovascular: Normal rate, regular rhythm and normal heart sounds.  No murmur heard. No BLE edema. Pulmonary/Chest: Effort normal  and breath sounds normal. No respiratory distress. She has no wheezes.  Skin: eczema changes left shoulder, lower back and flexor surface of wrist bilaterally. Palms and soles spared bilaterally. Face and anterior chest uninvolved. No vesicles, ulceration or satellite lesions. No plaque lesions  Psychiatric: She has a normal mood and affect. Her behavior is normal. Judgment and thought content normal.   Lab Results  Component Value Date   WBC 7.1 08/11/2011   HGB 13.4 08/11/2011   HCT 41.2 08/11/2011   PLT 215.0 08/11/2011   GLUCOSE 88 08/11/2011   CHOL 175 08/11/2011   TRIG 55.0 08/11/2011   HDL 69.10 08/11/2011   LDLCALC 95 08/11/2011   ALT 14 08/11/2011   AST 22 08/11/2011   NA 139 08/11/2011   K 4.4 08/11/2011   CL 104 08/11/2011   CREATININE 0.7 08/11/2011   BUN 13 08/11/2011   CO2 28 08/11/2011   TSH 4.95 08/11/2011       Assessment & Plan:   Eczema. Education regarding same provided. Given questionable shellfish ingestion causing same, will check food allergy panel today. Treat acute flare with IM Medrol 80 mg and oral antihistamine plus topical cortisone as needed.

## 2013-01-18 LAB — ALLERGEN FOOD PROFILE SPECIFIC IGE
Apple: <0.1 kU/L
Chicken IgE: <0.1 kU/L
Corn: <0.1 kU/L
Fish Cod: <0.1 kU/L
IgE (Immunoglobulin E), Serum: 111.5 IU/mL (ref 0.0–180.0)
Orange: <0.1 kU/L
Shrimp IgE: <0.1 kU/L
Tuna IgE: <0.1 kU/L
Wheat IgE: 0.12 kU/L — ABNORMAL HIGH

## 2013-02-11 ENCOUNTER — Ambulatory Visit (INDEPENDENT_AMBULATORY_CARE_PROVIDER_SITE_OTHER): Payer: BC Managed Care – PPO | Admitting: Internal Medicine

## 2013-02-11 ENCOUNTER — Encounter: Payer: Self-pay | Admitting: Internal Medicine

## 2013-02-11 VITALS — BP 100/78 | HR 61 | Temp 97.3°F

## 2013-02-11 DIAGNOSIS — T7840XA Allergy, unspecified, initial encounter: Secondary | ICD-10-CM | POA: Insufficient documentation

## 2013-02-11 DIAGNOSIS — L509 Urticaria, unspecified: Secondary | ICD-10-CM

## 2013-02-11 MED ORDER — LORATADINE 10 MG PO TABS: 10.0000 mg | ORAL_TABLET | Freq: Every day | ORAL | Status: DC | PRN

## 2013-02-11 NOTE — Progress Notes (Signed)
Pre-visit discussion using our clinic review tool. No additional management support is needed unless otherwise documented below in the visit note.  

## 2013-02-11 NOTE — Progress Notes (Signed)
   Subjective:    Patient ID: Sarah Randolph, female    DOB: September 12, 1990, 22 y.o.   MRN: [id number]  HPI  Recurrent rash - ?triggered by body lotion Onset 48h ago, but improved in past 12h since DC of lotion and use of benadryl last PM Itch also improved with sterid lotion ?refer to allergist  Past Medical History  Diagnosis Date  . HEAD TRAUMA, MILD 06/10/2008  . DIZZINESS 06/11/2009  . Chronic constipation     Review of Systems  Constitutional: Negative for fever, diaphoresis and fatigue.  HENT: Negative for facial swelling.   Respiratory: Negative for wheezing.   Cardiovascular: Negative for chest pain and leg swelling.  Skin: Positive for rash (improved in past 12h). Negative for color change and wound.  Allergic/Immunologic: Environmental allergies: ? Food allergies: ?  Neurological: Negative for light-headedness.       Objective:   Physical Exam BP 100/78  Pulse 61  Temp(Src) 97.3 F (36.3 C) (Oral)  SpO2 99% Wt Readings from Last 3 Encounters:  01/17/13 154 lb 1.9 oz (69.908 kg)  10/02/12 151 lb (68.493 kg)  08/11/11 162 lb (73.483 kg)   Constitutional: She appears well-developed and well-nourished. No distress.  Eyes: Conjunctivae and EOM are normal. Pupils are equal, round, and reactive to light. No scleral icterus.  Neck: Normal range of motion. Neck supple. No JVD present. No thyromegaly present.  Cardiovascular: Normal rate, regular rhythm and normal heart sounds.  No murmur heard. No BLE edema. Pulmonary/Chest: Effort normal and breath sounds normal. No respiratory distress. She has no wheezes.  Skin:  resolving hives L>R arm - no erythema or abnormal warmth Psychiatric: She has a normal mood and affect. Her behavior is normal. Judgment and thought content normal.   Lab Results  Component Value Date   WBC 7.1 08/11/2011   HGB 13.4 08/11/2011   HCT 41.2 08/11/2011   PLT 215.0 08/11/2011   GLUCOSE 88 08/11/2011   CHOL 175 08/11/2011   TRIG 55.0 08/11/2011     HDL 69.10 08/11/2011   LDLCALC 95 08/11/2011   ALT 14 08/11/2011   AST 22 08/11/2011   NA 139 08/11/2011   K 4.4 08/11/2011   CL 104 08/11/2011   CREATININE 0.7 08/11/2011   BUN 13 08/11/2011   CO2 28 08/11/2011   TSH 4.95 08/11/2011       Assessment & Plan:   Hives - ?topical allergy Recent visit in past 30d for eczema reviewed - questionable shellfish ingestion causing same at that time but no recurrent seafood ingestion continue oral antihistamine daily plus topical cortisone as needed. Refer to allergist per pt request

## 2013-02-11 NOTE — Patient Instructions (Addendum)
It was good to see you today.  Avoid lotion and other potential triggers until further evaluation by allergist  We have made referral to Dr. Willa Rough for allergy evaluation as requested. My office will call for this appointment once arranged  Begin Claritin 10 mg once daily. Continue using steroid lotion as needed for rash  Allergies Allergies may happen from anything your body is sensitive to. This may be food, medicines, pollens, chemicals, and nearly anything around you in everyday life that produces allergens. An allergen is anything that causes an allergy producing substance. Heredity is often a factor in causing these problems. This means you may have some of the same allergies as your parents. Food allergies happen in all age groups. Food allergies are some of the most severe and life threatening. Some common food allergies are cow's milk, seafood, eggs, nuts, wheat, and soybeans. SYMPTOMS   Swelling around the mouth.  An itchy red rash or hives.  Vomiting or diarrhea.  Difficulty breathing. SEVERE ALLERGIC REACTIONS ARE LIFE-THREATENING. This reaction is called anaphylaxis. It can cause the mouth and throat to swell and cause difficulty with breathing and swallowing. In severe reactions only a trace amount of food (for example, peanut oil in a salad) may cause death within seconds. Seasonal allergies occur in all age groups. These are seasonal because they usually occur during the same season every year. They may be a reaction to molds, grass pollens, or tree pollens. Other causes of problems are house dust mite allergens, pet dander, and mold spores. The symptoms often consist of nasal congestion, a runny itchy nose associated with sneezing, and tearing itchy eyes. There is often an associated itching of the mouth and ears. The problems happen when you come in contact with pollens and other allergens. Allergens are the particles in the air that the body reacts to with an allergic  reaction. This causes you to release allergic antibodies. Through a chain of events, these eventually cause you to release histamine into the blood stream. Although it is meant to be protective to the body, it is this release that causes your discomfort. This is why you were given anti-histamines to feel better. If you are unable to pinpoint the offending allergen, it may be determined by skin or blood testing. Allergies cannot be cured but can be controlled with medicine. Hay fever is a collection of all or some of the seasonal allergy problems. It may often be treated with simple over-the-counter medicine such as diphenhydramine. Take medicine as directed. Do not drink alcohol or drive while taking this medicine. Check with your caregiver or package insert for child dosages. If these medicines are not effective, there are many new medicines your caregiver can prescribe. Stronger medicine such as nasal spray, eye drops, and corticosteroids may be used if the first things you try do not work well. Other treatments such as immunotherapy or desensitizing injections can be used if all else fails. Follow up with your caregiver if problems continue. These seasonal allergies are usually not life threatening. They are generally more of a nuisance that can often be handled using medicine. HOME CARE INSTRUCTIONS   If unsure what causes a reaction, keep a diary of foods eaten and symptoms that follow. Avoid foods that cause reactions.  If hives or rash are present:  Take medicine as directed.  You may use an over-the-counter antihistamine (diphenhydramine) for hives and itching as needed.  Apply cold compresses (cloths) to the skin or take baths in cool water.  Avoid hot baths or showers. Heat will make a rash and itching worse.  If you are severely allergic:  Following a treatment for a severe reaction, hospitalization is often required for closer follow-up.  Wear a medic-alert bracelet or necklace stating  the allergy.  You and your family must learn how to give adrenaline or use an anaphylaxis kit.  If you have had a severe reaction, always carry your anaphylaxis kit or EpiPen with you. Use this medicine as directed by your caregiver if a severe reaction is occurring. Failure to do so could have a fatal outcome. SEEK MEDICAL CARE IF:  You suspect a food allergy. Symptoms generally happen within 30 minutes of eating a food.  Your symptoms have not gone away within 2 days or are getting worse.  You develop new symptoms.  You want to retest yourself or your child with a food or drink you think causes an allergic reaction. Never do this if an anaphylactic reaction to that food or drink has happened before. Only do this under the care of a caregiver. SEEK IMMEDIATE MEDICAL CARE IF:   You have difficulty breathing, are wheezing, or have a tight feeling in your chest or throat.  You have a swollen mouth, or you have hives, swelling, or itching all over your body.  You have had a severe reaction that has responded to your anaphylaxis kit or an EpiPen. These reactions may return when the medicine has worn off. These reactions should be considered life threatening. MAKE SURE YOU:   Understand these instructions.  Will watch your condition.  Will get help right away if you are not doing well or get worse. Document Released: 04/26/2002 Document Revised: 05/28/2012 Document Reviewed: 10/01/2007 [facility] Patient Information 2014 [place], [place].

## 2013-05-08 ENCOUNTER — Encounter: Payer: Self-pay | Admitting: Internal Medicine

## 2013-05-08 ENCOUNTER — Ambulatory Visit (INDEPENDENT_AMBULATORY_CARE_PROVIDER_SITE_OTHER): Payer: BC Managed Care – PPO | Admitting: Internal Medicine

## 2013-05-08 VITALS — BP 108/78 | HR 80 | Temp 98.7°F | Resp 13 | Wt 153.8 lb

## 2013-05-08 DIAGNOSIS — J060 Acute laryngopharyngitis: Secondary | ICD-10-CM

## 2013-05-08 MED ORDER — AZITHROMYCIN 250 MG PO TABS: ORAL_TABLET | ORAL | Status: DC

## 2013-05-08 NOTE — Patient Instructions (Addendum)
Please remain out of work until 05/10/2013. Plain Mucinex (NOT D) for thick secretions ;force NON dairy fluids .   Nasal cleansing in the shower as discussed with lather of mild shampoo.After 10 seconds wash off lather while  exhaling through nostrils. Make sure that all residual soap is removed to prevent irritation.  Flonase OR Nasacort AQ 1 spray in each nostril twice a day as needed. Use the "crossover" technique into opposite nostril spraying toward opposite ear @ 45 degree angle, not straight up into nostril.  Use a Neti pot daily only  as needed for significant sinus congestion; going from open side to congested side . Plain Allegra (NOT D )  160 daily , Loratidine 10 mg , OR Zyrtec 10 mg @ bedtime  as needed for itchy eyes & sneezing. Zicam Melts or Zinc lozenges as per package label for scratchy throat . Complementary options include  vitamin C 2000 mg daily; & Echinacea for 4-7 days.

## 2013-05-08 NOTE — Progress Notes (Signed)
   Subjective:    Patient ID: Sarah Randolph, female    DOB: 01/30/1991, 23 y.o.   MRN: [id number]  HPI Symptoms began yesterday as sore throat and frontal headache. Dry eyes, sneezing.  Denies nasal secretions; reports dry cough, no sputum production. Reports fever, chills, sweats.  Reports muscle pain and weakness for last 2 days. No sick contacts. Did not have flu shot.  Has tried Mucinex and Advil, unrelieved. Denies asthma. Patient is not a smoker.   Review of Systems Denies colored nasal secretions, wheezing, cough with secretions, maxillary or dental pain.      Objective:   Physical Exam General appearance:good health ;well nourished; no acute distress or increased work of breathing is present. No lymphadenopathy about the head, neck, or axilla noted.  Cervical lymphadenopathy with tenderness on exam.  Eyes: No conjunctival inflammation or lid edema is present.  Ears: External ear exam shows no significant lesions or deformities. Otoscopic examination reveals bilateral soft cerumen in canals.  Nose: R nasal polyp with > 50% obstruction to airflow. Nasal mucosa are pink and moist without lesions or exudates.No septal deviation.  Oral exam: Dental hygiene is good; lips and gums are healthy appearing.There is mild oropharyngeal erythema, no exudate noted. Significant oropharyngeal crowding.  Neck: No deformities, masses noted.  Heart: Normal rate and regular rhythm. S1 and S2 normal without gallop, murmur, click, rub or other extra sounds.  Lungs:Chest clear to auscultation; no wheezes, rhonchi,rales ,or rubs present.No increased work of breathing.  Extremities: No cyanosis, edema, or clubbing noted  Skin: Warm & dry .     Assessment & Plan:  #1 strep throat - [facility] Criteria employed: tender cervical lymphadenopathy, no cough, fever, no exudate -Zpack #2 allergic rhinitis - nasal cleansing program  #3 cerumen impaction - advised on mineral oil/H2O2 cleansing.

## 2013-05-08 NOTE — Progress Notes (Signed)
   Subjective:    Patient ID: Sarah Randolph, female    DOB: 03/19/1990, 23 y.o.   MRN: [id number]  HPI Symptoms began yesterday as sore throat and frontal headache. Dry eyes, sneezing.  Denies nasal secretions; reports dry cough, no sputum production. Reports fever, chills, sweats.  Reports muscle pain and weakness for last 2 days. No sick contacts. Did not have flu shot.  Has tried Mucinex and Advil, unrelieved. Denies asthma. Patient is not a smoker.     Review of Systems Denies colored nasal secretions, wheezing, cough with secretions, maxillary or dental pain.     Objective:   Physical Exam General appearance:good health ;well nourished; no acute distress or increased work of breathing is present.  No lymphadenopathy about the head, neck, or axilla noted.  Cervical lymph tenderness on exam.  Eyes: No conjunctival inflammation or lid edema is present. There is no scleral icterus. Ears:  External ear exam shows no significant lesions or deformities.  Otoscopic examination reveals bilateral soft cerumen in canals, tympanic membranes are intact bilaterally without bulging, retraction, inflammation or discharge. Nose:  R nasal polyp with > 50% obstruction to airflow. Nasal mucosa are pink and moist without lesions or exudates.No septal deviation.  Oral exam: Dental hygiene is good; lips and gums are healthy appearing.There is mild oropharyngeal erythema, no exudate noted. Significant oropharyngeal crowding.  Neck:  No deformities, masses noted. Heart:  Normal rate and regular rhythm. S1 and S2 normal without gallop, murmur, click, rub or other extra sounds.  Lungs:Chest clear to auscultation; no wheezes, rhonchi,rales ,or rubs present.No increased work of breathing.   Extremities:  No cyanosis, edema, or clubbing  noted  Skin: Warm & dry w/o jaundice or tenting.    Assessment & Plan:  #1 strep throat - ? Criterion: cervical lymphadenopathy, no cough, fever, no exudate  - PCN #2  allergic rhinitis - nasal cleansing program #3 cerumen impaction - advised on mineral oil/H2O2 cleansing.

## 2013-05-08 NOTE — Progress Notes (Signed)
Pre visit review using our clinic review tool, if applicable. No additional management support is needed unless otherwise documented below in the visit note. 

## 2013-05-10 ENCOUNTER — Other Ambulatory Visit: Payer: Self-pay | Admitting: Internal Medicine

## 2013-05-10 ENCOUNTER — Telehealth: Payer: Self-pay | Admitting: Internal Medicine

## 2013-05-10 MED ORDER — PREDNISONE 20 MG PO TABS: 20.0000 mg | ORAL_TABLET | Freq: Two times a day (BID) | ORAL | Status: DC

## 2013-05-10 NOTE — Telephone Encounter (Signed)
Ok for work note for today as well  Robin to handle please

## 2013-05-10 NOTE — Telephone Encounter (Signed)
Prednisone sent to [facility]

## 2013-05-10 NOTE — Telephone Encounter (Signed)
Pt was seen Wed.  She is still congested and still has a fever.  Dr. Alwyn RenHopper wrote a note for her to go back to work today, but she still doesn't feel like it.  Could she get a note to extend the out of work note?  What else can she take.  She is on a z-pack and is on Musinex.

## 2013-05-10 NOTE — Telephone Encounter (Signed)
Spoke with the pt's mother and informed her that a rx for Prednisone was sent to her pharmacy.  Also informed her that the work note has been approved and will be ready for her on Monday to pick-up.  Pt's mother understood and agreed.//AB/CMA

## 2013-06-19 ENCOUNTER — Telehealth: Payer: Self-pay | Admitting: Internal Medicine

## 2013-06-19 NOTE — Telephone Encounter (Signed)
Rec'd from Allergy & Asthma forward 2 pages  To Dr.Leschber

## 2013-10-23 ENCOUNTER — Encounter: Payer: Self-pay | Admitting: Internal Medicine

## 2013-10-23 ENCOUNTER — Ambulatory Visit (INDEPENDENT_AMBULATORY_CARE_PROVIDER_SITE_OTHER): Payer: 59 | Admitting: Internal Medicine

## 2013-10-23 VITALS — BP 98/62 | HR 70 | Temp 98.6°F | Ht 74.0 in | Wt 141.0 lb

## 2013-10-23 DIAGNOSIS — Z3009 Encounter for other general counseling and advice on contraception: Secondary | ICD-10-CM | POA: Insufficient documentation

## 2013-10-23 DIAGNOSIS — N946 Dysmenorrhea, unspecified: Secondary | ICD-10-CM | POA: Insufficient documentation

## 2013-10-23 LAB — POCT URINE PREGNANCY: Preg Test, Ur: NEGATIVE

## 2013-10-23 MED ORDER — MEDROXYPROGESTERONE ACETATE 150 MG/ML IM SUSP
150.0000 mg | Freq: Once | INTRAMUSCULAR | Status: AC
  Administered 2013-10-23: 150 mg via INTRAMUSCULAR

## 2013-10-23 NOTE — Patient Instructions (Signed)
Your urine pregnancy test was negative today  You had the depoprogesterone shot today  OK to return for Nurse visit in 3 mo for the next shot  Please establish with a new local GYN when due for your next pap smear  Please continue all other medications as before, and refills have been done if requested.  Please have the pharmacy call with any other refills you may need.  Please keep your appointments with your specialists as you may have planned

## 2013-10-23 NOTE — Assessment & Plan Note (Signed)
For tylenol or otc ibuprofen prn, also check urine pregnancy testing - if neg, to start depoprogestrone IM, f/u hereq 72mo or with GYN

## 2013-10-23 NOTE — Progress Notes (Signed)
Pre visit review using our clinic review tool, if applicable. No additional management support is needed unless otherwise documented below in the visit note. 

## 2013-10-23 NOTE — Assessment & Plan Note (Signed)
Also mentioned BCP and IUD options, pt opts for depoprog q 3 mo

## 2013-10-23 NOTE — Progress Notes (Signed)
   Subjective:    Patient ID: Sarah Randolph, female    DOB: Apr 30, 1990, 23 y.o.   MRN: [id number]  HPI  Here to resume depoprogesterone shots IM, has taken in the past without significant wt gain, taken for severe menstrual cramps in the past with good relief, nsaids did not work well. Last shot was jan 2015 in [place] she has been at school, and just recently began again with more regular menses, but still quite painful.  Last PAP < 1 yr, does not have locay GYN yet. Not recently sexually active, does not think she is pregnant.  No hx of STD, denies current symptoms, rash, or pelvic pain. Past Medical History  Diagnosis Date  . HEAD TRAUMA, MILD 06/10/2008  . DIZZINESS 06/11/2009  . Chronic constipation    Past Surgical History  Procedure Laterality Date  . Tonsillectomy  08/2009    Dr. Edd Fabian    reports that she has never smoked. She does not have any smokeless tobacco history on file. She reports that she drinks alcohol. She reports that she does not use illicit drugs. family history includes Hypertension in her other. No Known Allergies No current outpatient prescriptions on file prior to visit.   No current facility-administered medications on file prior to visit.   Review of Systems All otherwise neg per pt     Objective:   Physical Exam BP 98/62  Pulse 70  Temp(Src) 98.6 F (37 C) (Oral)  Ht  (1.88 m)  Wt 141 lb (63.957 kg)  BMI 18.10 kg/m2  SpO2 99% VS noted,  Constitutional: Pt appears well-developed, well-nourished.  HENT: Head: NCAT.  Right Ear: External ear normal.  Left Ear: External ear normal.  Eyes: . Pupils are equal, round, and reactive to light. Conjunctivae and EOM are normal Neck: Normal range of motion. Neck supple.  Cardiovascular: Normal rate and regular rhythm.   Pulmonary/Chest: Effort normal and breath sounds normal.  Abd:  Soft, NT, ND, + BS Neurological: Pt is alert. Not confused , motor grossly intact Skin: Skin is warm. No  rash Psychiatric: Pt behavior is normal. No agitation.  Pelvic: deferred    Assessment & Plan:

## 2014-01-22 ENCOUNTER — Ambulatory Visit (INDEPENDENT_AMBULATORY_CARE_PROVIDER_SITE_OTHER): Payer: 59

## 2014-01-22 DIAGNOSIS — Z789 Other specified health status: Secondary | ICD-10-CM

## 2014-01-22 DIAGNOSIS — Z309 Encounter for contraceptive management, unspecified: Secondary | ICD-10-CM

## 2014-01-22 MED ORDER — MEDROXYPROGESTERONE ACETATE 150 MG/ML IM SUSP
150.0000 mg | Freq: Once | INTRAMUSCULAR | Status: AC
  Administered 2014-01-22: 150 mg via INTRAMUSCULAR

## 2014-04-23 ENCOUNTER — Ambulatory Visit: Payer: 59

## 2014-04-24 ENCOUNTER — Ambulatory Visit: Payer: 59

## 2014-04-30 ENCOUNTER — Ambulatory Visit (INDEPENDENT_AMBULATORY_CARE_PROVIDER_SITE_OTHER): Payer: 59

## 2014-04-30 ENCOUNTER — Ambulatory Visit: Payer: 59 | Admitting: Family

## 2014-04-30 DIAGNOSIS — Z3042 Encounter for surveillance of injectable contraceptive: Secondary | ICD-10-CM

## 2014-04-30 MED ORDER — MEDROXYPROGESTERONE ACETATE 150 MG/ML IM SUSP
150.0000 mg | Freq: Once | INTRAMUSCULAR | Status: AC
  Administered 2014-04-30: 150 mg via INTRAMUSCULAR

## 2014-07-30 ENCOUNTER — Ambulatory Visit (INDEPENDENT_AMBULATORY_CARE_PROVIDER_SITE_OTHER): Payer: 59 | Admitting: *Deleted

## 2014-07-30 DIAGNOSIS — Z309 Encounter for contraceptive management, unspecified: Secondary | ICD-10-CM | POA: Diagnosis not present

## 2014-07-30 DIAGNOSIS — Z3009 Encounter for other general counseling and advice on contraception: Secondary | ICD-10-CM

## 2014-07-30 MED ORDER — MEDROXYPROGESTERONE ACETATE 150 MG/ML IM SUSP
150.0000 mg | Freq: Once | INTRAMUSCULAR | Status: AC
  Administered 2014-07-30: 150 mg via INTRAMUSCULAR

## 2014-10-29 ENCOUNTER — Ambulatory Visit: Payer: 59

## 2015-01-16 ENCOUNTER — Encounter: Payer: Self-pay | Admitting: Internal Medicine

## 2015-01-16 ENCOUNTER — Other Ambulatory Visit (INDEPENDENT_AMBULATORY_CARE_PROVIDER_SITE_OTHER): Payer: 59

## 2015-01-16 ENCOUNTER — Ambulatory Visit (INDEPENDENT_AMBULATORY_CARE_PROVIDER_SITE_OTHER): Payer: 59 | Admitting: Internal Medicine

## 2015-01-16 VITALS — BP 110/62 | HR 82 | Temp 98.1°F | Ht 74.0 in | Wt 149.0 lb

## 2015-01-16 DIAGNOSIS — Z0000 Encounter for general adult medical examination without abnormal findings: Secondary | ICD-10-CM

## 2015-01-16 DIAGNOSIS — Z202 Contact with and (suspected) exposure to infections with a predominantly sexual mode of transmission: Secondary | ICD-10-CM

## 2015-01-16 DIAGNOSIS — Z23 Encounter for immunization: Secondary | ICD-10-CM | POA: Diagnosis not present

## 2015-01-16 LAB — HEPATIC FUNCTION PANEL
ALBUMIN: 4.6 g/dL (ref 3.5–5.2)
ALT: 12 U/L (ref 0–35)
AST: 18 U/L (ref 0–37)
Alkaline Phosphatase: 66 U/L (ref 39–117)
BILIRUBIN TOTAL: 1 mg/dL (ref 0.2–1.2)
Bilirubin, Direct: 0.3 mg/dL (ref 0.0–0.3)
Total Protein: 7.9 g/dL (ref 6.0–8.3)

## 2015-01-16 LAB — CBC WITH DIFFERENTIAL/PLATELET
BASOS ABS: 0 10*3/uL (ref 0.0–0.1)
Basophils Relative: 0.4 % (ref 0.0–3.0)
Eosinophils Absolute: 0.1 10*3/uL (ref 0.0–0.7)
Eosinophils Relative: 1.7 % (ref 0.0–5.0)
HCT: 42.6 % (ref 36.0–46.0)
Hemoglobin: 13.8 g/dL (ref 12.0–15.0)
LYMPHS ABS: 2.1 10*3/uL (ref 0.7–4.0)
Lymphocytes Relative: 25.8 % (ref 12.0–46.0)
MCHC: 32.4 g/dL (ref 30.0–36.0)
MCV: 84.4 fl (ref 78.0–100.0)
MONO ABS: 0.5 10*3/uL (ref 0.1–1.0)
MONOS PCT: 5.8 % (ref 3.0–12.0)
NEUTROS ABS: 5.3 10*3/uL (ref 1.4–7.7)
NEUTROS PCT: 66.3 % (ref 43.0–77.0)
PLATELETS: 233 10*3/uL (ref 150.0–400.0)
RBC: 5.04 Mil/uL (ref 3.87–5.11)
RDW: 14.5 % (ref 11.5–15.5)
WBC: 8 10*3/uL (ref 4.0–10.5)

## 2015-01-16 LAB — BASIC METABOLIC PANEL
BUN: 8 mg/dL (ref 6–23)
CHLORIDE: 103 meq/L (ref 96–112)
CO2: 27 meq/L (ref 19–32)
CREATININE: 0.78 mg/dL (ref 0.40–1.20)
Calcium: 9.9 mg/dL (ref 8.4–10.5)
GFR: 115.66 mL/min (ref >60.00)
GLUCOSE: 100 mg/dL — AB (ref 70–99)
Potassium: 3.8 mEq/L (ref 3.5–5.1)
Sodium: 139 mEq/L (ref 135–145)

## 2015-01-16 LAB — URINALYSIS, ROUTINE W REFLEX MICROSCOPIC
Hgb urine dipstick: NEGATIVE
Leukocytes, UA: NEGATIVE
Nitrite: NEGATIVE
PH: 6 (ref 5.0–8.0)
Specific Gravity, Urine: >=1.03 — AB (ref 1.000–1.030)
TOTAL PROTEIN, URINE-UPE24: NEGATIVE
URINE GLUCOSE: NEGATIVE
Urobilinogen, UA: 1 (ref 0.0–1.0)

## 2015-01-16 LAB — LIPID PANEL
Cholesterol: 144 mg/dL (ref 0–200)
HDL: 63.1 mg/dL (ref >39.00)
LDL Cholesterol: 72 mg/dL (ref 0–99)
NonHDL: 81.06
TRIGLYCERIDES: 47 mg/dL (ref 0.0–149.0)
Total CHOL/HDL Ratio: 2
VLDL: 9.4 mg/dL (ref 0.0–40.0)

## 2015-01-16 LAB — TSH: TSH: 3.48 u[IU]/mL (ref 0.35–4.50)

## 2015-01-16 NOTE — Patient Instructions (Addendum)
You had the flu shot today  Please remember to call for your yearly GYN appointment  Please continue all other medications as before, and refills have been done if requested.  Please have the pharmacy call with any other refills you may need.  Please continue your efforts at being more active, low cholesterol diet, and weight control.  You are otherwise up to date with prevention measures today.  Please keep your appointments with your specialists as you may have planned  Please go to the LAB in the Basement (turn left off the elevator) for the tests to be done today  You will be contacted by phone if any changes need to be made immediately.  Otherwise, you will receive a letter about your results with an explanation, but please check with MyChart first.  Please remember to sign up for MyChart if you have not done so, as this will be important to you in the future with finding out test results, communicating by private email, and scheduling acute appointments online when needed.  Please return in 1 year for your yearly visit, or sooner if needed

## 2015-01-16 NOTE — Assessment & Plan Note (Signed)
Also for STD testing per pt request; see labs ordered

## 2015-01-16 NOTE — Progress Notes (Signed)
Pre visit review using our clinic review tool, if applicable. No additional management support is needed unless otherwise documented below in the visit note. 

## 2015-01-16 NOTE — Assessment & Plan Note (Signed)

## 2015-01-16 NOTE — Progress Notes (Signed)
Subjective:    Patient ID: Leslie AndreaJulissa J Cinquemani, female    DOB: 06/20/1990, 24 y.o.   MRN: [id number]  HPI  Here for wellness and f/u;  Overall doing ok;  Pt denies Chest pain, worsening SOB, DOE, wheezing, orthopnea, PND, worsening LE edema, palpitations, dizziness or syncope.  Pt denies neurological change such as new headache, facial or extremity weakness.  Pt denies polydipsia, polyuria, or low sugar symptoms. Pt states overall good compliance with treatment and medications, good tolerability, and has been trying to follow appropriate diet.  Pt denies worsening depressive symptoms, suicidal ideation or panic. No fever, night sweats, wt loss, loss of appetite, or other constitutional symptoms.  Pt states good ability with ADL's, has low fall risk, home safety reviewed and adequate, no other significant changes in hearing or vision, and still occas active with exercise, still plays basketball for fun, s/p full ride basketball scholarship, now for masters in soc work next yr. Though states she is low risk, asks for std testing and HIV, denies GU/GYn symptoms such as fever, pain, d/c, rash.  Plans to see GYn soon for routine care Past Medical History  Diagnosis Date  . HEAD TRAUMA, MILD 06/10/2008  . DIZZINESS 06/11/2009  . Chronic constipation    Past Surgical History  Procedure Laterality Date  . Tonsillectomy  08/2009    Dr. Edd Fabianrossley/ENT    reports that she has never smoked. She does not have any smokeless tobacco history on file. She reports that she drinks alcohol. She reports that she does not use illicit drugs. family history includes Hypertension in her other. No Known Allergies No current outpatient prescriptions on file prior to visit.   No current facility-administered medications on file prior to visit.    Review of Systems Constitutional: Negative for increased diaphoresis, other activity, appetite or siginficant weight change other than noted HENT: Negative for worsening hearing  loss, ear pain, facial swelling, mouth sores and neck stiffness.   Eyes: Negative for other worsening pain, redness or visual disturbance.  Respiratory: Negative for shortness of breath and wheezing  Cardiovascular: Negative for chest pain and palpitations.  Gastrointestinal: Negative for diarrhea, blood in stool, abdominal distention or other pain Genitourinary: Negative for hematuria, flank pain or change in urine volume.  Musculoskeletal: Negative for myalgias or other joint complaints.  Skin: Negative for color change and wound or drainage.  Neurological: Negative for syncope and numbness. other than noted Hematological: Negative for adenopathy. or other swelling Psychiatric/Behavioral: Negative for hallucinations, SI, self-injury, decreased concentration or other worsening agitation.      Objective:   Physical Exam BP 110/62 mmHg  Pulse 82  Temp(Src) 98.1 F (36.7 C) (Oral)  Ht 6\' 2"  (1.88 m)  Wt 149 lb (67.586 kg)  BMI 19.12 kg/m2  SpO2 98% VS noted,  Constitutional: Pt is oriented to person, place, and time. Appears well-developed and well-nourished, in no significant distress Head: Normocephalic and atraumatic.  Right Ear: External ear normal.  Left Ear: External ear normal.  Nose: Nose normal.  Mouth/Throat: Oropharynx is clear and moist.  Eyes: Conjunctivae and EOM are normal. Pupils are equal, round, and reactive to light.  Neck: Normal range of motion. Neck supple. No JVD present. No tracheal deviation present or significant neck LA or mass Cardiovascular: Normal rate, regular rhythm, normal heart sounds and intact distal pulses.   Pulmonary/Chest: Effort normal and breath sounds without rales or wheezing  Abdominal: Soft. Bowel sounds are normal. NT. No HSM  Musculoskeletal: Normal range of  motion. Exhibits no edema.  Lymphadenopathy:  Has no cervical adenopathy.  Neurological: Pt is alert and oriented to person, place, and time. Pt has normal reflexes. No cranial  nerve deficit. Motor grossly intact Skin: Skin is warm and dry. No rash noted.  Psychiatric:  Has normal mood and affect. Behavior is normal.     Assessment & Plan:

## 2015-01-17 LAB — HEPATITIS PANEL, ACUTE
HCV AB: NEGATIVE
Hep A IgM: NONREACTIVE
Hep B C IgM: NONREACTIVE
Hepatitis B Surface Ag: NEGATIVE

## 2015-01-17 LAB — RPR

## 2015-01-19 LAB — HSV 2 ANTIBODY, IGG

## 2015-01-19 LAB — HIV ANTIBODY (ROUTINE TESTING W REFLEX): HIV: NONREACTIVE

## 2015-01-20 LAB — GC/CHLAMYDIA PROBE AMP, URINE
Chlamydia, Swab/Urine, PCR: NOT DETECTED
GC PROBE AMP, URINE: NOT DETECTED

## 2015-01-21 ENCOUNTER — Encounter: Payer: Self-pay | Admitting: Internal Medicine

## 2015-12-17 ENCOUNTER — Encounter: Payer: Self-pay | Admitting: Internal Medicine

## 2015-12-17 ENCOUNTER — Ambulatory Visit (INDEPENDENT_AMBULATORY_CARE_PROVIDER_SITE_OTHER): Payer: 59 | Admitting: Internal Medicine

## 2015-12-17 VITALS — BP 118/76 | HR 76 | Temp 98.2°F | Resp 20 | Wt 146.8 lb

## 2015-12-17 DIAGNOSIS — Z23 Encounter for immunization: Secondary | ICD-10-CM | POA: Diagnosis not present

## 2015-12-17 DIAGNOSIS — Z0000 Encounter for general adult medical examination without abnormal findings: Secondary | ICD-10-CM | POA: Diagnosis not present

## 2015-12-17 NOTE — Progress Notes (Signed)
Pre visit review using our clinic review tool, if applicable. No additional management support is needed unless otherwise documented below in the visit note. 

## 2015-12-17 NOTE — Progress Notes (Signed)
Subjective:    Patient ID: Leslie AndreaJulissa J Eckels, female    DOB: 03/05/1990, 25 y.o.   MRN: [id number]  HPI  Here for wellness and f/u;  Overall doing ok;  Pt denies Chest pain, worsening SOB, DOE, wheezing, orthopnea, PND, worsening LE edema, palpitations, dizziness or syncope.  Pt denies neurological change such as new headache, facial or extremity weakness.  Pt denies polydipsia, polyuria, or low sugar symptoms. Pt states overall good compliance with treatment and medications, good tolerability, and has been trying to follow appropriate diet.  Pt denies worsening depressive symptoms, suicidal ideation or panic. No fever, night sweats, wt loss, loss of appetite, or other constitutional symptoms.  Pt states good ability with ADL's, has low fall risk, home safety reviewed and adequate, no other significant changes in hearing or vision, and less but still quite active with exercise, after she graduated with a basketball scholarship several yrs ago, working as Event organiserwaitress/bartender.  Due for GYN and asks for referral Wt Readings from Last 3 Encounters:  12/17/15 146 lb 12 oz (66.6 kg)  01/16/15 149 lb (67.6 kg)  10/23/13 141 lb (64 kg)   Past Medical History:  Diagnosis Date  . Chronic constipation   . DIZZINESS 06/11/2009  . HEAD TRAUMA, MILD 06/10/2008   Past Surgical History:  Procedure Laterality Date  . TONSILLECTOMY  08/2009   Dr. Edd Fabianrossley/ENT    reports that she has never smoked. She does not have any smokeless tobacco history on file. She reports that she drinks alcohol. She reports that she does not use drugs. family history includes Hypertension in her other. No Known Allergies No current outpatient prescriptions on file prior to visit.   No current facility-administered medications on file prior to visit.     Review of Systems Constitutional: Negative for increased diaphoresis, or other activity, appetite or siginficant weight change other than noted HENT: Negative for worsening  hearing loss, ear pain, facial swelling, mouth sores and neck stiffness.   Eyes: Negative for other worsening pain, redness or visual disturbance.  Respiratory: Negative for choking or stridor Cardiovascular: Negative for other chest pain and palpitations.  Gastrointestinal: Negative for worsening diarrhea, blood in stool, or abdominal distention Genitourinary: Negative for hematuria, flank pain or change in urine volume.  Musculoskeletal: Negative for myalgias or other joint complaints.  Skin: Negative for other color change and wound or drainage.  Neurological: Negative for syncope and numbness. other than noted Hematological: Negative for adenopathy. or other swelling Psychiatric/Behavioral: Negative for hallucinations, SI, self-injury, decreased concentration or other worsening agitation.  All other system neg    Objective:   Physical Exam BP 118/76   Pulse 76   Temp 98.2 F (36.8 C) (Oral)   Resp 20   Wt 146 lb 12 oz (66.6 kg)   SpO2 99%   BMI 18.84 kg/m  VS noted,  Constitutional: Pt is oriented to person, place, and time. Appears well-developed and well-nourished, in no significant distress Head: Normocephalic and atraumatic  Eyes: Conjunctivae and EOM are normal. Pupils are equal, round, and reactive to light Right Ear: External ear normal.  Left Ear: External ear normal Nose: Nose normal.  Mouth/Throat: Oropharynx is clear and moist  Neck: Normal range of motion. Neck supple. No JVD present. No tracheal deviation present or significant neck LA or mass Cardiovascular: Normal rate, regular rhythm, normal heart sounds and intact distal pulses.   Pulmonary/Chest: Effort normal and breath sounds without rales or wheezing  Abdominal: Soft. Bowel sounds are normal. NT.  No HSM  Musculoskeletal: Normal range of motion. Exhibits no edema Lymphadenopathy: Has no cervical adenopathy.  Neurological: Pt is alert and oriented to person, place, and time. Pt has normal reflexes. No  cranial nerve deficit. Motor grossly intact Skin: Skin is warm and dry. No rash noted or new ulcers Psychiatric:  Has normal mood and affect. Behavior is normal.  No other significant exam findings.    Assessment & Plan:

## 2015-12-17 NOTE — Patient Instructions (Addendum)
You had the flu shot today   Please continue all other medications as before, and refills have been done if requested.  Please have the pharmacy call with any other refills you may need.  Please continue your efforts at being more active, low cholesterol diet, and weight control.  You are otherwise up to date with prevention measures today.  Please keep your appointments with your specialists as you may have planned  You will be contacted regarding the referral for: GYN  Please return in 1 year for your yearly visit, or sooner if needed, with Lab testing done 3-5 days before

## 2015-12-18 NOTE — Assessment & Plan Note (Signed)

## 2018-02-13 ENCOUNTER — Encounter (HOSPITAL_COMMUNITY): Payer: Self-pay

## 2018-02-13 ENCOUNTER — Other Ambulatory Visit: Payer: Self-pay

## 2018-02-13 ENCOUNTER — Emergency Department (HOSPITAL_COMMUNITY)
Admission: EM | Admit: 2018-02-13 | Discharge: 2018-02-13 | Disposition: A | Discharge status: Home / Self Care | Payer: 59 | Attending: Emergency Medicine | Admitting: Emergency Medicine

## 2018-02-13 DIAGNOSIS — F17290 Nicotine dependence, other tobacco product, uncomplicated: Secondary | ICD-10-CM | POA: Insufficient documentation

## 2018-02-13 DIAGNOSIS — R112 Nausea with vomiting, unspecified: Secondary | ICD-10-CM

## 2018-02-13 DIAGNOSIS — R197 Diarrhea, unspecified: Secondary | ICD-10-CM

## 2018-02-13 DIAGNOSIS — J111 Influenza due to unidentified influenza virus with other respiratory manifestations: Secondary | ICD-10-CM | POA: Insufficient documentation

## 2018-02-13 MED ORDER — IBUPROFEN 200 MG PO TABS
600.0000 mg | ORAL_TABLET | Freq: Once | ORAL | Status: AC
  Administered 2018-02-13: 600 mg via ORAL
  Filled 2018-02-13: qty 3

## 2018-02-13 MED ORDER — ONDANSETRON 4 MG PO TBDP: 4.0000 mg | ORAL_TABLET | Freq: Three times a day (TID) | ORAL | 0 refills | Status: DC | PRN

## 2018-02-13 MED ORDER — PROMETHAZINE-DM 6.25-15 MG/5ML PO SYRP: 5.0000 mL | ORAL_SOLUTION | Freq: Four times a day (QID) | ORAL | 0 refills | Status: DC | PRN

## 2018-02-13 NOTE — ED Triage Notes (Signed)
Pt reports beginning yesterday she has had body aches and chills. Pt reports cough and congestion. Pt reports she thinks she has had a fever but has not checked with a thermometer.

## 2018-02-13 NOTE — ED Notes (Signed)
Pt provided with water

## 2018-02-13 NOTE — ED Provider Notes (Signed)
[facility]-EMERGENCY DEPT Provider Note   CSN: [id number] Arrival date & time: 02/13/18  0804     History   Chief Complaint Chief Complaint  Patient presents with  . Flu Like Symptoms    HPI Sarah Randolph is a 27 y.o. female who presents to the emergency department with a chief complaint of "I want to know if I have the flu so I can go out tonight."  The patient endorses body aches, nonproductive cough, subjective fever, chills that began yesterday.  She also endorses 3 episodes of nonbloody, nonbilious emesis that began yesterday with one episode of nonbloody diarrhea, epigastric abdominal pain, and bilateral low back pain that also began yesterday.  She characterizes the epigastric and back pain as throbbing.  Nonradiating.  Constant, but improving since onset.  She denies chest pain, shortness of breath, visual changes, numbness, weakness, dysuria, hematuria, vaginal pain, bleeding, or discharge, or constipation.  She reports that she has been able to eat crackers since our last episode of vomiting, which was last night.   She treated her symptoms at home with DayQuil this morning.  She reports moderate improvement in her symptoms with DayQuil administration.  LMP was the end of November.  She has no pregnancy concerns.  No history of peptic ulcers.  She is not up-to-date on her flu shot.  She has no chronic medical problems.  The history is provided by the patient. No language interpreter was used.    Past Medical History:  Diagnosis Date  . Chronic constipation   . DIZZINESS 06/11/2009  . HEAD TRAUMA, MILD 06/10/2008    Patient Active Problem List   Diagnosis Date Noted  . STD exposure 01/16/2015  . Counseling for initiation of birth control method 10/23/2013  . Severe menstrual cramps 10/23/2013  . Allergic reaction   . Axillary lump 08/11/2011  . Syncope 10/06/2010  . Preventative health care 09/30/2010    Past Surgical History:    Procedure Laterality Date  . TONSILLECTOMY  08/2009   Dr. Edd Fabianrossley/ENT     OB History   No obstetric history on file.      Home Medications    Prior to Admission medications   Medication Sig Start Date End Date Taking? Authorizing Provider  ondansetron (ZOFRAN ODT) 4 MG disintegrating tablet Take 1 tablet (4 mg total) by mouth every 8 (eight) hours as needed for nausea or vomiting. 02/13/18   ,  A, PA-C  promethazine-dextromethorphan (PROMETHAZINE-DM) 6.25-15 MG/5ML syrup Take 5 mLs by mouth 4 (four) times daily as needed for cough. 02/13/18   , Coral Else A, PA-C    Family History Family History  Problem Relation Age of Onset  . Hypertension Other     Social History Social History   Tobacco Use  . Smoking status: Current Some Day Smoker    Types: Cigars  . Smokeless tobacco: Never Used  Substance Use Topics  . Alcohol use: Yes    Comment: occ  . Drug use: No     Allergies   Patient has no known allergies.   Review of Systems Review of Systems  Constitutional: Positive for chills and fever. Negative for activity change.  Respiratory: Positive for cough. Negative for shortness of breath.   Cardiovascular: Negative for chest pain, palpitations and leg swelling.  Gastrointestinal: Positive for abdominal pain, diarrhea, nausea and vomiting. Negative for anal bleeding, blood in stool and constipation.  Genitourinary: Negative for dysuria, frequency, menstrual problem, urgency, vaginal bleeding, vaginal discharge and vaginal  pain.  Musculoskeletal: Positive for myalgias. Negative for back pain, joint swelling, neck pain and neck stiffness.  Skin: Negative for rash.  Allergic/Immunologic: Negative for immunocompromised state.  Neurological: Negative for seizures, weakness and headaches.  Psychiatric/Behavioral: Negative for confusion.   Physical Exam Updated Vital Signs BP 104/82 (BP Location: Left Arm)   Pulse 98   Temp 99.7 F (37.6 C) (Oral)    Resp 16   Ht 6\' 1"  (1.854 m)   Wt 72.6 kg   SpO2 100%   BMI 21.11 kg/m   Physical Exam Vitals signs and nursing note reviewed.  Constitutional:      General: She is not in acute distress.    Comments: Warm to the touch. No diaphoresis.   HENT:     Head: Normocephalic.     Right Ear: Tympanic membrane and ear canal normal.     Left Ear: Tympanic membrane and ear canal normal.     Mouth/Throat:     Mouth: Mucous membranes are moist.     Pharynx: No oropharyngeal exudate or posterior oropharyngeal erythema.  Eyes:     Conjunctiva/sclera: Conjunctivae normal.  Neck:     Musculoskeletal: Neck supple.  Cardiovascular:     Rate and Rhythm: Normal rate and regular rhythm.     Heart sounds: No murmur. No friction rub. No gallop.   Pulmonary:     Effort: Pulmonary effort is normal. No respiratory distress.     Breath sounds: No stridor. No wheezing, rhonchi or rales.  Abdominal:     General: There is no distension.     Palpations: Abdomen is soft. There is no mass.     Tenderness: There is no right CVA tenderness, left CVA tenderness, guarding or rebound.     Hernia: No hernia is present.     Comments: Minimal discomfort with palpation to the epigastric region.  Abdomen is soft, nondistended.  No rebound or guarding.  No CVA tenderness bilaterally.  Negative Murphy sign.  Musculoskeletal:     Right lower leg: No edema.     Left lower leg: No edema.     Comments: Diffusely tender to palpation throughout the musculature of the lumbar region.  No midline tenderness.  Skin:    General: Skin is warm.     Findings: No rash.  Neurological:     Mental Status: She is alert and oriented to person, place, and time.  Psychiatric:        Behavior: Behavior normal.      ED Treatments / Results  Labs (all labs ordered are listed, but only abnormal results are displayed) Labs Reviewed - No data to display  EKG None  Radiology No results found.  Procedures Procedures (including  critical care time)  Medications Ordered in ED Medications  ibuprofen (ADVIL,MOTRIN) tablet 600 mg (600 mg Oral Given 02/13/18 0908)     Initial Impression / Assessment and Plan / ED Course  I have reviewed the triage vital signs and the nursing notes.  Pertinent labs & imaging results that were available during my care of the patient were reviewed by me and considered in my medical decision making (see chart for details).     27 year old female with no pertinent past medical history presenting with flulike symptoms, 3 episodes of nonbilious emesis and one episode of nonbloody diarrhea.  Symptoms began within the last 24 hours.  On exam, she feels warm to the touch.  Oral temp is 99.7.  Ibuprofen given.  Nausea has improved and  she has been able to tolerate crackers at home without vomiting.  Will fluid challenge the patient here.  She is concerned about influenza as she did not receive a flu shot this year.  Discussed with the patient that symptoms are consistent with influenza-like illness versus other viral etiology and testing is not indicated at this time.  We also discussed the risk versus benefits of Tamiflu, and she declined prescription at this time.  I have a low suspicion for intra-abdominal pathology as the etiology of her vomiting and diarrhea given the length of her symptoms.  Her abdominal exam is benign and she does not have a surgical abdomen.  Will discharge to home with supportive care.  She has been given strict return precautions to the emergency department.  She is hemodynamically stable and in no acute distress.  She is safe for discharge to home with outpatient follow-up at this time.  Final Clinical Impressions(s) / ED Diagnoses   Final diagnoses:  Influenza  Nausea vomiting and diarrhea    ED Discharge Orders         Ordered    ondansetron (ZOFRAN ODT) 4 MG disintegrating tablet  Every 8 hours PRN     02/13/18 0929    promethazine-dextromethorphan  (PROMETHAZINE-DM) 6.25-15 MG/5ML syrup  4 times daily PRN     02/13/18 0929           Barkley Boards, PA-C 02/13/18 1610    Marily Memos, MD 02/13/18 1439

## 2018-02-13 NOTE — Discharge Instructions (Signed)
Thank you for allowing me to care for you today in the Emergency Department.   Your symptoms are consistent with a viral illness, most likely the flu.  Antibiotics do not treat viral illnesses.  To feel better, it is important to treat your symptoms.  For fever, headache, and body aches, take 650 mg of Tylenol every 6 hours or 600 mg of ibuprofen with food every 6 hours.  You can also alternate between these 2 medications every 3 hours.  Please note, DayQuil also has Tylenol (acetaminophen) and it.  Most likely it contains 325 mg, which is not a large enough dose to treat your symptoms.  If you continue to take DayQuil, you can take a maximum of 650 mg of Tylenol every 6 hours.  Make sure that you do not take any other medications that have Tylenol in it.  You should not take more than 4000 mg of Tylenol in a 24-hour period.  For nausea and vomiting, let 1 tablet of Zofran dissolve in your tongue every 8 hours as needed.  Take 5 mLs of Promethazine DM every 6 hours as needed for cough or cold symptoms.  You can return to work once you have been free from a fever or diarrhea for more than 24 hours.  To avoid spread of infection, it is important that you wash your hands with warm soap and water after vomiting or with diarrhea or anytime that you use the bathroom.  You should also cover your mouth when you cough and make sure that you are washing your hands frequently.  Elderly people and small babies may have a more severe course of the flu so use caution around these individuals until your symptoms significantly improve.  You should return to the emergency department if you develop high fever of greater than 100.5 persistently despite taking Tylenol and ibuprofen as described above, persistent vomiting despite taking Zofran, severe abdominal pain that does not improve with the treatment above, bloody or black stools or vomit, or other new, concerning symptoms.

## 2018-05-14 ENCOUNTER — Telehealth: Payer: Self-pay | Admitting: Obstetrics and Gynecology

## 2018-05-14 ENCOUNTER — Emergency Department (HOSPITAL_COMMUNITY)
Admission: EM | Admit: 2018-05-14 | Discharge: 2018-05-14 | Disposition: A | Discharge status: Home / Self Care | Payer: Self-pay | Attending: Emergency Medicine | Admitting: Emergency Medicine

## 2018-05-14 ENCOUNTER — Other Ambulatory Visit: Payer: Self-pay

## 2018-05-14 ENCOUNTER — Encounter (HOSPITAL_COMMUNITY): Payer: Self-pay | Admitting: Emergency Medicine

## 2018-05-14 DIAGNOSIS — Z3202 Encounter for pregnancy test, result negative: Secondary | ICD-10-CM | POA: Insufficient documentation

## 2018-05-14 DIAGNOSIS — R1030 Lower abdominal pain, unspecified: Secondary | ICD-10-CM | POA: Insufficient documentation

## 2018-05-14 DIAGNOSIS — R102 Pelvic and perineal pain: Secondary | ICD-10-CM | POA: Insufficient documentation

## 2018-05-14 DIAGNOSIS — F17290 Nicotine dependence, other tobacco product, uncomplicated: Secondary | ICD-10-CM | POA: Insufficient documentation

## 2018-05-14 LAB — I-STAT BETA HCG BLOOD, ED (MC, WL, AP ONLY): I-stat hCG, quantitative: <5 m[IU]/mL (ref <5)

## 2018-05-14 NOTE — ED Provider Notes (Signed)
[facility] EMERGENCY DEPARTMENT Provider Note   CSN: [id number] Arrival date & time: 05/14/18  1221    History   Chief Complaint No chief complaint on file.   HPI SARALYN CARDOZA is a 28 y.o. female.     The history is provided by the patient and medical records. No language interpreter was used.   EMREY BERO is a 28 y.o. female who presents to the Emergency Department requesting blood pregnancy test.  Patient states that she has not had a period in 2 months. This is abnormal for her. She typically has very regular cycles. Intermittent lower abdominal cramping x 5 days. None now though. No n/v. No urinary symptoms. No vaginal discharge. She states that she has taken several home urine pregnancy tests that have all been negative, however she still feels like she could be pregnant.   Past Medical History:  Diagnosis Date  . Chronic constipation   . DIZZINESS 06/11/2009  . HEAD TRAUMA, MILD 06/10/2008    Patient Active Problem List   Diagnosis Date Noted  . STD exposure 01/16/2015  . Counseling for initiation of birth control method 10/23/2013  . Severe menstrual cramps 10/23/2013  . Allergic reaction   . Axillary lump 08/11/2011  . Syncope 10/06/2010  . Preventative health care 09/30/2010    Past Surgical History:  Procedure Laterality Date  . TONSILLECTOMY  08/2009   Dr. Edd Fabian     OB History   No obstetric history on file.      Home Medications    Prior to Admission medications   Medication Sig Start Date End Date Taking? Authorizing Provider  ondansetron (ZOFRAN ODT) 4 MG disintegrating tablet Take 1 tablet (4 mg total) by mouth every 8 (eight) hours as needed for nausea or vomiting. 02/13/18   McDonald, Mia A, PA-C  promethazine-dextromethorphan (PROMETHAZINE-DM) 6.25-15 MG/5ML syrup Take 5 mLs by mouth 4 (four) times daily as needed for cough. 02/13/18   McDonald, Coral Else, PA-C    Family History Family History  Problem  Relation Age of Onset  . Hypertension Other     Social History Social History   Tobacco Use  . Smoking status: Current Some Day Smoker    Types: Cigars  . Smokeless tobacco: Never Used  Substance Use Topics  . Alcohol use: Yes    Comment: occ  . Drug use: No     Allergies   Patient has no known allergies.   Review of Systems Review of Systems  Constitutional: Negative for fever.  Gastrointestinal: Positive for abdominal pain (Resolved). Negative for diarrhea, nausea and vomiting.  Genitourinary: Negative for dysuria, urgency, vaginal bleeding and vaginal discharge.  Musculoskeletal: Negative for back pain.     Physical Exam Updated Vital Signs BP 122/89 (BP Location: Right Arm)   Pulse (!) 111   Temp 98 F (36.7 C) (Oral)   Resp 16   LMP 03/14/2018 (Approximate)   SpO2 99%   Physical Exam Vitals signs and nursing note reviewed.  Constitutional:      General: She is not in acute distress.    Appearance: She is well-developed.  HENT:     Head: Normocephalic and atraumatic.  Neck:     Musculoskeletal: Neck supple.  Cardiovascular:     Rate and Rhythm: Regular rhythm.     Heart sounds: Normal heart sounds. No murmur.  Pulmonary:     Effort: Pulmonary effort is normal. No respiratory distress.     Breath sounds: Normal breath sounds.  Abdominal:     General: There is no distension.     Palpations: Abdomen is soft.     Comments: No abdominal tenderness.  Skin:    General: Skin is warm and dry.  Neurological:     Mental Status: She is alert and oriented to person, place, and time.      ED Treatments / Results  Labs (all labs ordered are listed, but only abnormal results are displayed) Labs Reviewed  I-STAT BETA HCG BLOOD, ED ([facility], [facility], [facility] ONLY)    EKG None  Radiology No results found.  Procedures Procedures (including critical care time)  Medications Ordered in ED Medications - No data to display   Initial Impression / Assessment and Plan  / ED Course  I have reviewed the triage vital signs and the nursing notes.  Pertinent labs & imaging results that were available during my care of the patient were reviewed by me and considered in my medical decision making (see chart for details).       TYEESHA HERIN is a 28 y.o. female who presents to ED requesting blood pregnancy test.  Patient has had no period for the last 2 months.  No urinary symptoms.  No vaginal discharge.  Denies concern for STDs.  Benign abdominal exam.  I-STAT hCG here negative.  Recommended further care with OB/GYN.  Reasons to return to ER discussed and all questions answered.    Final Clinical Impressions(s) / ED Diagnoses   Final diagnoses:  Negative pregnancy test    ED Discharge Orders    None       Antonia Culbertson, Chase Picket, PA-C 05/14/18 1308    Melene Plan, DO 05/14/18 1313

## 2018-05-14 NOTE — ED Triage Notes (Addendum)
Pt reports no period for 2 months, states she has taken at home pregnancy tests that have all come back negative. Pt report abdominal cramping, unwilling to give further information.

## 2018-05-14 NOTE — Discharge Instructions (Signed)
Your pregnancy test was negative today.  You should call your OBGYN and schedule a follow up appointment.  Return to ER for new or worsening symptoms, any additional concerns.

## 2018-05-14 NOTE — ED Notes (Signed)
Patient verbalizes understanding of discharge instructions . Opportunity for questions and answers were provided . Armband removed by staff ,Pt discharged from ED. W/C  offered at D/C  and Declined W/C at D/C and was escorted to lobby by RN.  

## 2018-06-13 NOTE — Telephone Encounter (Signed)
Opened in error

## 2018-07-05 ENCOUNTER — Emergency Department (HOSPITAL_COMMUNITY): Payer: Self-pay

## 2018-07-05 ENCOUNTER — Emergency Department (HOSPITAL_COMMUNITY)
Admission: EM | Admit: 2018-07-05 | Discharge: 2018-07-05 | Disposition: A | Discharge status: Home / Self Care | Payer: Self-pay | Attending: Emergency Medicine | Admitting: Emergency Medicine

## 2018-07-05 ENCOUNTER — Encounter (HOSPITAL_COMMUNITY): Payer: Self-pay | Admitting: Emergency Medicine

## 2018-07-05 ENCOUNTER — Other Ambulatory Visit: Payer: Self-pay

## 2018-07-05 DIAGNOSIS — T07XXXA Unspecified multiple injuries, initial encounter: Secondary | ICD-10-CM | POA: Insufficient documentation

## 2018-07-05 DIAGNOSIS — F17290 Nicotine dependence, other tobacco product, uncomplicated: Secondary | ICD-10-CM | POA: Insufficient documentation

## 2018-07-05 DIAGNOSIS — S0181XA Laceration without foreign body of other part of head, initial encounter: Secondary | ICD-10-CM | POA: Insufficient documentation

## 2018-07-05 DIAGNOSIS — Z23 Encounter for immunization: Secondary | ICD-10-CM | POA: Insufficient documentation

## 2018-07-05 DIAGNOSIS — Y998 Other external cause status: Secondary | ICD-10-CM | POA: Insufficient documentation

## 2018-07-05 DIAGNOSIS — Y929 Unspecified place or not applicable: Secondary | ICD-10-CM | POA: Insufficient documentation

## 2018-07-05 DIAGNOSIS — Y9389 Activity, other specified: Secondary | ICD-10-CM | POA: Insufficient documentation

## 2018-07-05 LAB — POC URINE PREG, ED: Preg Test, Ur: NEGATIVE

## 2018-07-05 MED ORDER — TETANUS-DIPHTH-ACELL PERTUSSIS 5-2.5-18.5 LF-MCG/0.5 IM SUSP
0.5000 mL | Freq: Once | INTRAMUSCULAR | Status: AC
  Administered 2018-07-05: 20:00:00 0.5 mL via INTRAMUSCULAR
  Filled 2018-07-05: qty 0.5

## 2018-07-05 MED ORDER — ACETAMINOPHEN 500 MG PO TABS
1000.0000 mg | ORAL_TABLET | Freq: Once | ORAL | Status: AC
  Administered 2018-07-05: 20:00:00 1000 mg via ORAL
  Filled 2018-07-05: qty 2

## 2018-07-05 MED ORDER — BACITRACIN ZINC 500 UNIT/GM EX OINT
TOPICAL_OINTMENT | CUTANEOUS | Status: AC
  Administered 2018-07-05: 1
  Filled 2018-07-05: qty 0.9

## 2018-07-05 NOTE — ED Notes (Addendum)
Reviewed discharge paper work with pt. Pt verbalizes understanding and has no questions at this time.
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
## 2018-07-05 NOTE — Discharge Instructions (Addendum)
You may take tylenol and ibuprofen for pain, put ice or heat over sore areas. For your wounds, keep them clean and you may apply antibiotic ointment. Keep the chin wound covered, apply antibiotic ointment and change bandage once per day for one week.

## 2018-07-05 NOTE — ED Notes (Signed)
Pt transported to Xray/CT 

## 2018-07-05 NOTE — ED Notes (Signed)
Wound Care Completed on chin laceration and left cheek abrasion.

## 2019-02-20 ENCOUNTER — Ambulatory Visit (INDEPENDENT_AMBULATORY_CARE_PROVIDER_SITE_OTHER): Payer: BC Managed Care – PPO | Admitting: Internal Medicine

## 2019-02-20 ENCOUNTER — Encounter: Payer: Self-pay | Admitting: Internal Medicine

## 2019-02-20 ENCOUNTER — Other Ambulatory Visit: Payer: Self-pay

## 2019-02-20 VITALS — BP 118/70 | HR 74 | Temp 98.4°F | Ht 73.0 in | Wt 143.4 lb

## 2019-02-20 DIAGNOSIS — L732 Hidradenitis suppurativa: Secondary | ICD-10-CM | POA: Insufficient documentation

## 2019-02-20 DIAGNOSIS — Z0000 Encounter for general adult medical examination without abnormal findings: Secondary | ICD-10-CM

## 2019-02-20 DIAGNOSIS — Z0001 Encounter for general adult medical examination with abnormal findings: Secondary | ICD-10-CM

## 2019-02-20 DIAGNOSIS — Z202 Contact with and (suspected) exposure to infections with a predominantly sexual mode of transmission: Secondary | ICD-10-CM

## 2019-02-20 LAB — HEPATIC FUNCTION PANEL
ALT: 13 U/L (ref 0–35)
AST: 20 U/L (ref 0–37)
Albumin: 4.6 g/dL (ref 3.5–5.2)
Alkaline Phosphatase: 54 U/L (ref 39–117)
Bilirubin, Direct: 0.1 mg/dL (ref 0.0–0.3)
Total Bilirubin: 0.5 mg/dL (ref 0.2–1.2)
Total Protein: 7.7 g/dL (ref 6.0–8.3)

## 2019-02-20 LAB — CBC WITH DIFFERENTIAL/PLATELET
Basophils Absolute: 0.1 10*3/uL (ref 0.0–0.1)
Basophils Relative: 0.8 % (ref 0.0–3.0)
Eosinophils Absolute: 0.2 10*3/uL (ref 0.0–0.7)
Eosinophils Relative: 2.4 % (ref 0.0–5.0)
HCT: 37.3 % (ref 36.0–46.0)
Hemoglobin: 12 g/dL (ref 12.0–15.0)
Lymphocytes Relative: 34.4 % (ref 12.0–46.0)
Lymphs Abs: 3 10*3/uL (ref 0.7–4.0)
MCHC: 32.3 g/dL (ref 30.0–36.0)
MCV: 80.2 fl (ref 78.0–100.0)
Monocytes Absolute: 0.7 10*3/uL (ref 0.1–1.0)
Monocytes Relative: 7.6 % (ref 3.0–12.0)
Neutro Abs: 4.8 10*3/uL (ref 1.4–7.7)
Neutrophils Relative %: 54.8 % (ref 43.0–77.0)
Platelets: 231 10*3/uL (ref 150.0–400.0)
RBC: 4.65 Mil/uL (ref 3.87–5.11)
RDW: 18.9 % — ABNORMAL HIGH (ref 11.5–15.5)
WBC: 8.8 10*3/uL (ref 4.0–10.5)

## 2019-02-20 LAB — LIPID PANEL
Cholesterol: 170 mg/dL (ref 0–200)
HDL: 82.4 mg/dL (ref >39.00)
LDL Cholesterol: 79 mg/dL (ref 0–99)
NonHDL: 88.03
Total CHOL/HDL Ratio: 2
Triglycerides: 43 mg/dL (ref 0.0–149.0)
VLDL: 8.6 mg/dL (ref 0.0–40.0)

## 2019-02-20 LAB — BASIC METABOLIC PANEL
BUN: 11 mg/dL (ref 6–23)
CO2: 28 mEq/L (ref 19–32)
Calcium: 9.5 mg/dL (ref 8.4–10.5)
Chloride: 101 mEq/L (ref 96–112)
Creatinine, Ser: 0.67 mg/dL (ref 0.40–1.20)
GFR: 125.76 mL/min (ref >60.00)
Glucose, Bld: 85 mg/dL (ref 70–99)
Potassium: 3.5 mEq/L (ref 3.5–5.1)
Sodium: 134 mEq/L — ABNORMAL LOW (ref 135–145)

## 2019-02-20 LAB — TSH: TSH: 2.42 u[IU]/mL (ref 0.35–4.50)

## 2019-02-20 MED ORDER — AZITHROMYCIN 250 MG PO TABS: ORAL_TABLET | ORAL | 1 refills | Status: DC

## 2019-02-20 NOTE — Assessment & Plan Note (Signed)
Also for std testing as ordered,  to f/u any worsening symptoms or concerns

## 2019-02-20 NOTE — Patient Instructions (Signed)
Please take all new medication as prescribed - the antibiotic  Please call for dermatology referral (or mychart message) if not improving  Please continue all other medications as before, and refills have been done if requested.  Please have the pharmacy call with any other refills you may need.  Please continue your efforts at being more active, low cholesterol diet, and weight control.  You are otherwise up to date with prevention measures today.  Please keep your appointments with your specialists as you may have planned  Please go to the LAB at the blood drawing area for the tests to be done -   You will be contacted by phone if any changes need to be made immediately.  Otherwise, you will receive a letter about your results with an explanation, but please check with MyChart first.  Please remember to sign up for MyChart if you have not done so, as this will be important to you in the future with finding out test results, communicating by private email, and scheduling acute appointments online when needed.  Please return in 1 year for your yearly visit, or sooner if needed

## 2019-02-20 NOTE — Progress Notes (Signed)
Subjective:    Patient ID: Sarah Randolph, female    DOB: May 07, 1990, 29 y.o.   MRN: [id number]  HPI  Here for wellness and f/u;  Overall doing ok;  Pt denies Chest pain, worsening SOB, DOE, wheezing, orthopnea, PND, worsening LE edema, palpitations, dizziness or syncope.  Pt denies neurological change such as new headache, facial or extremity weakness.  Pt denies polydipsia, polyuria, or low sugar symptoms. Pt states overall good compliance with treatment and medications, good tolerability, and has been trying to follow appropriate diet.  Pt denies worsening depressive symptoms, suicidal ideation or panic. No fever, night sweats, wt loss, loss of appetite, or other constitutional symptoms.  Pt states good ability with ADL's, has low fall risk, home safety reviewed and adequate, no other significant changes in hearing or vision, and active with exercise. 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   Also may have had STD exposure, asks for testing, denies pelvic pain, rash or d/c.  Also has multiple tender bumps to left axilla, without fever or drainage for 5 days, mild, constant, nothing makes better or worse. Past Medical History:  Diagnosis Date  . Chronic constipation   . DIZZINESS 06/11/2009  . HEAD TRAUMA, MILD 06/10/2008   Past Surgical History:  Procedure Laterality Date  . TONSILLECTOMY  08/2009   Dr. Edd Fabian    reports that she has been smoking cigars. She has never used smokeless tobacco. She reports current alcohol use. She reports that she does not use drugs. family history includes Hypertension in an other family member. No Known Allergies Current Outpatient Medications on File Prior to Visit  Medication Sig Dispense Refill  . ondansetron (ZOFRAN ODT) 4 MG disintegrating tablet Take 1 tablet (4 mg total) by mouth every 8 (eight) hours as needed for nausea or vomiting. (Patient not taking: Reported on 07/05/2018) 20 tablet 0  .  promethazine-dextromethorphan (PROMETHAZINE-DM) 6.25-15 MG/5ML syrup Take 5 mLs by mouth 4 (four) times daily as needed for cough. (Patient not taking: Reported on 07/05/2018) 118 mL 0   No current facility-administered medications on file prior to visit.   Review of Systems Constitutional: Negative for other unusual diaphoresis, sweats, appetite or weight changes HENT: Negative for other worsening hearing loss, ear pain, facial swelling, mouth sores or neck stiffness.   Eyes: Negative for other worsening pain, redness or other visual disturbance.  Respiratory: Negative for other stridor or swelling Cardiovascular: Negative for other palpitations or other chest pain  Gastrointestinal: Negative for worsening diarrhea or loose stools, blood in stool, distention or other pain Genitourinary: Negative for hematuria, flank pain or other change in urine volume.  Musculoskeletal: Negative for myalgias or other joint swelling.  Skin: Negative for other color change, or other wound or worsening drainage.  Neurological: Negative for other syncope or numbness. Hematological: Negative for other adenopathy or swelling Psychiatric/Behavioral: Negative for hallucinations, other worsening agitation, SI, self-injury, or new decreased concentration All otherwise neg per pt     Objective:   Physical Exam BP 118/70   Pulse 74   Temp 98.4 F (36.9 C)   Ht 6\' 1"  (1.854 m)   Wt 143 lb 6.4 oz (65 kg)   SpO2 98%   BMI 18.92 kg/m  VS noted,  Constitutional: Pt is oriented to person, place, and time. Appears well-developed and well-nourished, in no significant distress and comfortable Head: Normocephalic and atraumatic  Eyes: Conjunctivae and EOM are normal. Pupils are equal, round, and reactive to  light Right Ear: External ear normal without discharge Left Ear: External ear normal without discharge Nose: Nose without discharge or deformity Mouth/Throat: Oropharynx is without other ulcerations and moist   Neck: Normal range of motion. Neck supple. No JVD present. No tracheal deviation present or significant neck LA or mass Cardiovascular: Normal rate, regular rhythm, normal heart sounds and intact distal pulses.   Pulmonary/Chest: WOB normal and breath sounds without rales or wheezing  Abdominal: Soft. Bowel sounds are normal. NT. No HSM  Musculoskeletal: Normal range of motion. Exhibits no edema Lymphadenopathy: Has no other cervical adenopathy.  Neurological: Pt is alert and oriented to person, place, and time. Pt has normal reflexes. No cranial nerve deficit. Motor grossly intact, Gait intact Skin: Skin is warm and dry. No rash noted or new ulcerations Psychiatric:  Has normal mood and affect. Behavior is normal without agitation All otherwise neg per pt  Lab Results  Component Value Date   WBC 8.8 02/20/2019   HGB 12.0 02/20/2019   HCT 37.3 02/20/2019   PLT 231.0 02/20/2019   GLUCOSE 85 02/20/2019   CHOL 170 02/20/2019   TRIG 43.0 02/20/2019   HDL 82.40 02/20/2019   LDLCALC 79 02/20/2019   ALT 13 02/20/2019   AST 20 02/20/2019   NA 134 (L) 02/20/2019   K 3.5 02/20/2019   CL 101 02/20/2019   CREATININE 0.67 02/20/2019   BUN 11 02/20/2019   CO2 28 02/20/2019   TSH 2.42 02/20/2019      Assessment & Plan:

## 2019-02-20 NOTE — Assessment & Plan Note (Signed)

## 2019-02-20 NOTE — Assessment & Plan Note (Addendum)
Mild to mod, for antibx course,  to f/u any worsening symptoms or concerns  In addition to the time spent performing CPE, I spent an additional 15 minutes face to face,in which greater than 50% of this time was spent in counseling and coordination of care for patient's illness as documented, including the differential dx, treatment, further evaluation and other management of hidradenitis, std exposure

## 2019-02-21 ENCOUNTER — Encounter: Payer: Self-pay | Admitting: Internal Medicine

## 2019-02-21 LAB — RPR: RPR Ser Ql: NONREACTIVE

## 2019-02-21 LAB — HIV ANTIBODY (ROUTINE TESTING W REFLEX): HIV 1&2 Ab, 4th Generation: NONREACTIVE

## 2019-02-21 LAB — URINALYSIS, ROUTINE W REFLEX MICROSCOPIC
Bilirubin Urine: NEGATIVE
Hgb urine dipstick: NEGATIVE
Ketones, ur: NEGATIVE
Leukocytes,Ua: NEGATIVE
Nitrite: NEGATIVE
RBC / HPF: NONE SEEN (ref 0–?)
Specific Gravity, Urine: >=1.03 — AB (ref 1.000–1.030)
Total Protein, Urine: NEGATIVE
Urine Glucose: NEGATIVE
Urobilinogen, UA: 1 (ref 0.0–1.0)
WBC, UA: NONE SEEN (ref 0–?)
pH: 6 (ref 5.0–8.0)

## 2019-02-21 LAB — GC/CHLAMYDIA PROBE AMP
Chlamydia trachomatis, NAA: NEGATIVE
Neisseria Gonorrhoeae by PCR: NEGATIVE

## 2019-02-21 LAB — HSV 2 ANTIBODY, IGG: HSV 2 Glycoprotein G Ab, IgG: <0.9 index

## 2020-02-17 ENCOUNTER — Ambulatory Visit: Payer: BC Managed Care – PPO | Admitting: Podiatry

## 2020-03-10 ENCOUNTER — Encounter (HOSPITAL_COMMUNITY): Payer: Self-pay

## 2020-03-10 ENCOUNTER — Emergency Department (HOSPITAL_COMMUNITY)
Admission: EM | Admit: 2020-03-10 | Discharge: 2020-03-10 | Disposition: A | Discharge status: Home / Self Care | Payer: BC Managed Care – PPO | Attending: Emergency Medicine | Admitting: Emergency Medicine

## 2020-03-10 ENCOUNTER — Other Ambulatory Visit: Payer: Self-pay

## 2020-03-10 DIAGNOSIS — L0231 Cutaneous abscess of buttock: Secondary | ICD-10-CM | POA: Insufficient documentation

## 2020-03-10 DIAGNOSIS — L0291 Cutaneous abscess, unspecified: Secondary | ICD-10-CM

## 2020-03-10 DIAGNOSIS — F17290 Nicotine dependence, other tobacco product, uncomplicated: Secondary | ICD-10-CM | POA: Diagnosis not present

## 2020-03-10 MED ORDER — IBUPROFEN 200 MG PO TABS
600.0000 mg | ORAL_TABLET | Freq: Once | ORAL | Status: AC
  Administered 2020-03-10: 600 mg via ORAL
  Filled 2020-03-10: qty 3

## 2020-03-10 MED ORDER — CLINDAMYCIN HCL 150 MG PO CAPS: 300.0000 mg | ORAL_CAPSULE | Freq: Three times a day (TID) | ORAL | 0 refills | Status: AC

## 2020-03-10 MED ORDER — CLINDAMYCIN HCL 300 MG PO CAPS
300.0000 mg | ORAL_CAPSULE | Freq: Once | ORAL | Status: AC
  Administered 2020-03-10: 300 mg via ORAL
  Filled 2020-03-10: qty 1

## 2020-03-10 MED ORDER — IBUPROFEN 600 MG PO TABS: 600.0000 mg | ORAL_TABLET | Freq: Three times a day (TID) | ORAL | 0 refills | Status: DC | PRN

## 2020-03-10 NOTE — ED Provider Notes (Signed)
[facility]-EMERGENCY DEPT Provider Note   CSN: [id number] Arrival date & time: 03/10/20  1940     History Chief Complaint  Patient presents with  . Abscess    Sarah Randolph is a 30 y.o. female.  HPI   Patient started having pain in the buttock around the perianal region a few days ago.  Patient was concerned that she is developing a boil.  She contacted a telemedicine provider and was given a prescription for antibiotics but patient did not pick that up yet.  Her symptoms started worsening today so she came to the ED.  While waiting to be seen the patient states the boil started spontaneously draining.  The pain has improved.  She denies any fevers or chills.  No vomiting or diarrhea.  Past Medical History:  Diagnosis Date  . Chronic constipation   . DIZZINESS 06/11/2009  . HEAD TRAUMA, MILD 06/10/2008    Patient Active Problem List   Diagnosis Date Noted  . Hidradenitis 02/20/2019  . STD exposure 01/16/2015  . Counseling for initiation of birth control method 10/23/2013  . Severe menstrual cramps 10/23/2013  . Allergic reaction   . Axillary lump 08/11/2011  . Syncope 10/06/2010  . Encounter for well adult exam with abnormal findings 09/30/2010    Past Surgical History:  Procedure Laterality Date  . TONSILLECTOMY  08/2009   Dr. Edd Fabian     OB History   No obstetric history on file.     Family History  Problem Relation Age of Onset  . Hypertension Other     Social History   Tobacco Use  . Smoking status: Current Some Day Smoker    Types: Cigars  . Smokeless tobacco: Never Used  Vaping Use  . Vaping Use: Never used  Substance Use Topics  . Alcohol use: Yes    Comment: occ  . Drug use: No    Home Medications Prior to Admission medications   Medication Sig Start Date End Date Taking? Authorizing Provider  clindamycin (CLEOCIN) 150 MG capsule Take 2 capsules (300 mg total) by mouth 3 (three) times daily for 7 days.  03/10/20 03/17/20 Yes Linwood Dibbles, MD  ibuprofen (ADVIL) 600 MG tablet Take 1 tablet (600 mg total) by mouth every 8 (eight) hours as needed. 03/10/20  Yes Linwood Dibbles, MD  azithromycin [facility]) 250 MG tablet 2 tab by mouth day 1, then 1 per day 02/20/19   Corwin Levins, MD  ondansetron (ZOFRAN ODT) 4 MG disintegrating tablet Take 1 tablet (4 mg total) by mouth every 8 (eight) hours as needed for nausea or vomiting. Patient not taking: Reported on 07/05/2018 02/13/18   Frederik Pear A, PA-C  promethazine-dextromethorphan (PROMETHAZINE-DM) 6.25-15 MG/5ML syrup Take 5 mLs by mouth 4 (four) times daily as needed for cough. Patient not taking: Reported on 07/05/2018 02/13/18   Frederik Pear A, PA-C    Allergies    Patient has no known allergies.  Review of Systems   Review of Systems  All other systems reviewed and are negative.   Physical Exam Updated Vital Signs BP 106/70   Pulse 88   Temp 98.1 F (36.7 C) (Oral)   Resp 18   SpO2 99%   Physical Exam Vitals and nursing note reviewed.  Constitutional:      General: She is not in acute distress.    Appearance: She is well-developed.  HENT:     Head: Normocephalic and atraumatic.     Right Ear: External ear normal.  Left Ear: External ear normal.  Eyes:     General: No scleral icterus.       Right eye: No discharge.        Left eye: No discharge.     Conjunctiva/sclera: Conjunctivae normal.  Neck:     Trachea: No tracheal deviation.  Cardiovascular:     Rate and Rhythm: Normal rate.  Pulmonary:     Effort: Pulmonary effort is normal. No respiratory distress.     Breath sounds: No stridor.  Abdominal:     General: There is no distension.  Genitourinary:    Comments: Indurated area on the left buttock in the perianal region, copious bloody purulent drainage from the open wound Musculoskeletal:        General: No swelling or deformity.     Cervical back: Neck supple.  Skin:    General: Skin is warm and dry.     Findings: No  rash.  Neurological:     Mental Status: She is alert.     Cranial Nerves: Cranial nerve deficit: no gross deficits.     ED Results / Procedures / Treatments   Labs (all labs ordered are listed, but only abnormal results are displayed) Labs Reviewed - No data to display  EKG None  Radiology No results found.  Procedures Procedures   Medications Ordered in ED Medications  clindamycin (CLEOCIN) capsule 300 mg (has no administration in time range)  ibuprofen (ADVIL) tablet 600 mg (has no administration in time range)    ED Course  I have reviewed the triage vital signs and the nursing notes.  Pertinent labs & imaging results that were available during my care of the patient were reviewed by me and considered in my medical decision making (see chart for details).    MDM Rules/Calculators/A&P                         Patient has a perirectal abscess on exam.  She is having copious spontaneous drainage.  I discussed the option of anesthetizing the area and performing incision and drainage.  I also discussed the options since the wound was already spontaneous draining to do antibiotics and warm soaks and reassessment.  Patient would prefer not to have the incision and drainage right now since the area is already draining.  Will discharge home with course of antibiotics and pain meds. Final Clinical Impression(s) / ED Diagnoses Final diagnoses:  Abscess    Rx / DC Orders ED Discharge Orders         Ordered    clindamycin (CLEOCIN) 150 MG capsule  3 times daily        03/10/20 2230    ibuprofen (ADVIL) 600 MG tablet  Every 8 hours PRN        03/10/20 2230           Linwood Dibbles, MD 03/10/20 2234

## 2020-03-10 NOTE — Discharge Instructions (Signed)
Take the antibiotics as prescribed.  Continue to do warm soaks.  Return to the ED to be rechecked if the symptoms are not improving.

## 2020-03-10 NOTE — ED Notes (Signed)
An After Visit Summary was printed and given to the patient. Discharge instructions given and no further questions at this time.  

## 2020-03-10 NOTE — ED Triage Notes (Signed)
Patient arrived stating that on Friday she began having rectal pain, concerned for a possible boil. Declines taking any OTC medication prior to arrival. Was prescribed antibiotics but did not go pick them up from the pharmacy.

## 2020-07-01 ENCOUNTER — Encounter (HOSPITAL_COMMUNITY): Payer: Self-pay

## 2020-07-01 ENCOUNTER — Other Ambulatory Visit: Payer: Self-pay

## 2020-07-01 ENCOUNTER — Emergency Department (HOSPITAL_COMMUNITY)
Admission: EM | Admit: 2020-07-01 | Discharge: 2020-07-02 | Disposition: A | Discharge status: Home / Self Care | Payer: BC Managed Care – PPO | Attending: Emergency Medicine | Admitting: Emergency Medicine

## 2020-07-01 DIAGNOSIS — Y9367 Activity, basketball: Secondary | ICD-10-CM | POA: Insufficient documentation

## 2020-07-01 DIAGNOSIS — S299XXA Unspecified injury of thorax, initial encounter: Secondary | ICD-10-CM | POA: Insufficient documentation

## 2020-07-01 DIAGNOSIS — R1010 Upper abdominal pain, unspecified: Secondary | ICD-10-CM | POA: Insufficient documentation

## 2020-07-01 DIAGNOSIS — W500XXA Accidental hit or strike by another person, initial encounter: Secondary | ICD-10-CM | POA: Diagnosis not present

## 2020-07-01 DIAGNOSIS — Z5321 Procedure and treatment not carried out due to patient leaving prior to being seen by health care provider: Secondary | ICD-10-CM | POA: Insufficient documentation

## 2020-07-01 DIAGNOSIS — R079 Chest pain, unspecified: Secondary | ICD-10-CM | POA: Insufficient documentation

## 2020-07-01 DIAGNOSIS — F17290 Nicotine dependence, other tobacco product, uncomplicated: Secondary | ICD-10-CM | POA: Diagnosis not present

## 2020-07-01 DIAGNOSIS — R42 Dizziness and giddiness: Secondary | ICD-10-CM | POA: Diagnosis present

## 2020-07-01 NOTE — ED Triage Notes (Signed)
Left sided chest pain and dizziness x 2 weeks.

## 2020-07-02 ENCOUNTER — Other Ambulatory Visit: Payer: Self-pay

## 2020-07-02 ENCOUNTER — Encounter (HOSPITAL_BASED_OUTPATIENT_CLINIC_OR_DEPARTMENT_OTHER): Payer: Self-pay | Admitting: Emergency Medicine

## 2020-07-02 ENCOUNTER — Emergency Department (HOSPITAL_BASED_OUTPATIENT_CLINIC_OR_DEPARTMENT_OTHER)
Admission: EM | Admit: 2020-07-02 | Discharge: 2020-07-02 | Disposition: A | Discharge status: Home / Self Care | Payer: BC Managed Care – PPO | Source: Home / Self Care | Attending: Emergency Medicine | Admitting: Emergency Medicine

## 2020-07-02 ENCOUNTER — Emergency Department (HOSPITAL_COMMUNITY): Payer: BC Managed Care – PPO

## 2020-07-02 ENCOUNTER — Emergency Department (HOSPITAL_BASED_OUTPATIENT_CLINIC_OR_DEPARTMENT_OTHER): Payer: BC Managed Care – PPO

## 2020-07-02 DIAGNOSIS — T1490XA Injury, unspecified, initial encounter: Secondary | ICD-10-CM

## 2020-07-02 DIAGNOSIS — W500XXA Accidental hit or strike by another person, initial encounter: Secondary | ICD-10-CM | POA: Insufficient documentation

## 2020-07-02 DIAGNOSIS — S299XXA Unspecified injury of thorax, initial encounter: Secondary | ICD-10-CM | POA: Insufficient documentation

## 2020-07-02 DIAGNOSIS — R1010 Upper abdominal pain, unspecified: Secondary | ICD-10-CM | POA: Insufficient documentation

## 2020-07-02 DIAGNOSIS — F17290 Nicotine dependence, other tobacco product, uncomplicated: Secondary | ICD-10-CM | POA: Insufficient documentation

## 2020-07-02 DIAGNOSIS — Y9367 Activity, basketball: Secondary | ICD-10-CM | POA: Insufficient documentation

## 2020-07-02 DIAGNOSIS — R0789 Other chest pain: Secondary | ICD-10-CM

## 2020-07-02 LAB — I-STAT BETA HCG BLOOD, ED (MC, WL, AP ONLY): I-stat hCG, quantitative: <5 m[IU]/mL (ref <5)

## 2020-07-02 LAB — CBC
HCT: 33.6 % — ABNORMAL LOW (ref 36.0–46.0)
HCT: 35.3 % — ABNORMAL LOW (ref 36.0–46.0)
Hemoglobin: 11.1 g/dL — ABNORMAL LOW (ref 12.0–15.0)
Hemoglobin: 11.5 g/dL — ABNORMAL LOW (ref 12.0–15.0)
MCH: 25.3 pg — ABNORMAL LOW (ref 26.0–34.0)
MCH: 25.3 pg — ABNORMAL LOW (ref 26.0–34.0)
MCHC: 32.6 g/dL (ref 30.0–36.0)
MCHC: 33 g/dL (ref 30.0–36.0)
MCV: 76.5 fL — ABNORMAL LOW (ref 80.0–100.0)
MCV: 77.8 fL — ABNORMAL LOW (ref 80.0–100.0)
Platelets: 265 10*3/uL (ref 150–400)
Platelets: 281 10*3/uL (ref 150–400)
RBC: 4.39 MIL/uL (ref 3.87–5.11)
RBC: 4.54 MIL/uL (ref 3.87–5.11)
RDW: 19 % — ABNORMAL HIGH (ref 11.5–15.5)
RDW: 19.5 % — ABNORMAL HIGH (ref 11.5–15.5)
WBC: 5.9 10*3/uL (ref 4.0–10.5)
WBC: 7.9 10*3/uL (ref 4.0–10.5)
nRBC: 0 % (ref 0.0–0.2)
nRBC: 0 % (ref 0.0–0.2)

## 2020-07-02 LAB — COMPREHENSIVE METABOLIC PANEL
ALT: 23 U/L (ref 0–44)
AST: 33 U/L (ref 15–41)
Albumin: 4.3 g/dL (ref 3.5–5.0)
Alkaline Phosphatase: 41 U/L (ref 38–126)
Anion gap: 11 (ref 5–15)
BUN: 6 mg/dL (ref 6–20)
CO2: 24 mmol/L (ref 22–32)
Calcium: 8.7 mg/dL — ABNORMAL LOW (ref 8.9–10.3)
Chloride: 105 mmol/L (ref 98–111)
Creatinine, Ser: 0.74 mg/dL (ref 0.44–1.00)
GFR, Estimated: >60 mL/min (ref >60)
Glucose, Bld: 101 mg/dL — ABNORMAL HIGH (ref 70–99)
Potassium: 3.4 mmol/L — ABNORMAL LOW (ref 3.5–5.1)
Sodium: 140 mmol/L (ref 135–145)
Total Bilirubin: 0.4 mg/dL (ref 0.3–1.2)
Total Protein: 7.6 g/dL (ref 6.5–8.1)

## 2020-07-02 LAB — BASIC METABOLIC PANEL
Anion gap: 11 (ref 5–15)
BUN: 5 mg/dL — ABNORMAL LOW (ref 6–20)
CO2: 24 mmol/L (ref 22–32)
Calcium: 9.2 mg/dL (ref 8.9–10.3)
Chloride: 101 mmol/L (ref 98–111)
Creatinine, Ser: 0.83 mg/dL (ref 0.44–1.00)
GFR, Estimated: >60 mL/min (ref >60)
Glucose, Bld: 105 mg/dL — ABNORMAL HIGH (ref 70–99)
Potassium: 3.2 mmol/L — ABNORMAL LOW (ref 3.5–5.1)
Sodium: 136 mmol/L (ref 135–145)

## 2020-07-02 LAB — TROPONIN I (HIGH SENSITIVITY): Troponin I (High Sensitivity): 3 ng/L (ref <18)

## 2020-07-02 LAB — D-DIMER, QUANTITATIVE: D-Dimer, Quant: 0.45 ug/mL-FEU (ref 0.00–0.50)

## 2020-07-02 LAB — LIPASE, BLOOD: Lipase: 37 U/L (ref 11–51)

## 2020-07-02 MED ORDER — METHOCARBAMOL 500 MG PO TABS: 500.0000 mg | ORAL_TABLET | Freq: Four times a day (QID) | ORAL | 0 refills | Status: DC | PRN

## 2020-07-02 MED ORDER — IBUPROFEN 800 MG PO TABS: 800.0000 mg | ORAL_TABLET | Freq: Three times a day (TID) | ORAL | 0 refills | Status: DC

## 2020-07-02 NOTE — Discharge Instructions (Addendum)
1.  At this time it appears you are still having muscle spasm and strain in the chest wall.  Your CT scan did not show any broken ribs or bruised lung.  You do not appear to have any significant injuries to your kidney or your spleen. 2.  Take ibuprofen 800 mg every 8 hours with food.  Take Robaxin, a muscle relaxer with this.  Follow-up with your doctor for recheck to see if your symptoms are improving.  If you are continue to have significant pain with movements you may need some physical therapy or further evaluation.

## 2020-07-02 NOTE — ED Triage Notes (Signed)
Pt from home related to rib pain in her left side from An injury that occurred May 8th. Pt was seen at [facility] for same last night.

## 2020-07-02 NOTE — ED Notes (Signed)
Called pts name 3x for updated VS with no response. Pt not seen in lobby or outside at this time.  

## 2020-07-02 NOTE — ED Provider Notes (Signed)
[facility] EMERGENCY DEPT Provider Note   CSN: [id number] Arrival date & time: 07/02/20  0827     History Chief Complaint  Patient presents with  . Rib Injury    Sarah Randolph is a 30 y.o. female.  HPI Patient ports 2 weeks ago she was playing basketball.  Another player struck her very hard in the left lower chest wall and upper abdomen.  They collided and the other person's head hit her very hard in that area.  She reports she thought it would have improved by now.  She reports she continues to have a lot of pain in her lower left chest and upper abdomen.  She reports she has to have to hold her hand against that area to be able to lie back.  It hurts to take a deep breath or to raise her shoulder more than 90 degrees.  She has not had any coughing.  She does report about a week ago she thought she had a mild cold.  She reports he had a fever up to 101 but symptoms resolved quickly and she feels back to normal.  She reports at basketball practice, she has only been able to run a lap or 2 and is mostly sitting out most of the practice.  She reports she is experiencing lightheadedness with position change.  No syncope    Past Medical History:  Diagnosis Date  . Chronic constipation   . DIZZINESS 06/11/2009  . HEAD TRAUMA, MILD 06/10/2008    Patient Active Problem List   Diagnosis Date Noted  . Hidradenitis 02/20/2019  . STD exposure 01/16/2015  . Counseling for initiation of birth control method 10/23/2013  . Severe menstrual cramps 10/23/2013  . Allergic reaction   . Axillary lump 08/11/2011  . Syncope 10/06/2010  . Encounter for well adult exam with abnormal findings 09/30/2010    Past Surgical History:  Procedure Laterality Date  . TONSILLECTOMY  08/2009   Dr. Edd Fabian     OB History   No obstetric history on file.     Family History  Problem Relation Age of Onset  . Hypertension Other     Social History   Tobacco Use  . Smoking  status: Current Some Day Smoker    Types: Cigars  . Smokeless tobacco: Never Used  Vaping Use  . Vaping Use: Never used  Substance Use Topics  . Alcohol use: Yes    Comment: occ  . Drug use: No    Home Medications Prior to Admission medications   Medication Sig Start Date End Date Taking? Authorizing Provider  ibuprofen (ADVIL) 800 MG tablet Take 1 tablet (800 mg total) by mouth 3 (three) times daily. 07/02/20  Yes Arby Barrette, MD  methocarbamol (ROBAXIN) 500 MG tablet Take 1 tablet (500 mg total) by mouth every 6 (six) hours as needed for muscle spasms. 07/02/20  Yes Arby Barrette, MD  azithromycin (ZITHROMAX) 250 MG tablet 2 tab by mouth day 1, then 1 per day 02/20/19   Corwin Levins, MD  ibuprofen (ADVIL) 600 MG tablet Take 1 tablet (600 mg total) by mouth every 8 (eight) hours as needed. 03/10/20   Linwood Dibbles, MD  ondansetron (ZOFRAN ODT) 4 MG disintegrating tablet Take 1 tablet (4 mg total) by mouth every 8 (eight) hours as needed for nausea or vomiting. Patient not taking: Reported on 07/05/2018 02/13/18   Frederik Pear A, PA-C  promethazine-dextromethorphan (PROMETHAZINE-DM) 6.25-15 MG/5ML syrup Take 5 mLs by mouth 4 (four) times  daily as needed for cough. Patient not taking: Reported on 07/05/2018 02/13/18   Frederik Pear A, PA-C    Allergies    Patient has no known allergies.  Review of Systems   Review of Systems 10 systems reviewed negative except as per HPI Physical Exam Updated Vital Signs BP 111/87 (BP Location: Left Arm)   Pulse 78   Temp 99.2 F (37.3 C) (Oral)   Resp 18   Ht 6\' 2"  (1.88 m)   Wt 88.5 kg   LMP 06/22/2020   SpO2 100%   BMI 25.04 kg/m   Physical Exam Constitutional:      Comments: Alert nontoxic.  No respiratory distress.  Clinically well in appearance.  HENT:     Head: Normocephalic and atraumatic.     Mouth/Throat:     Pharynx: Oropharynx is clear.  Eyes:     Extraocular Movements: Extraocular movements intact.  Cardiovascular:      Rate and Rhythm: Normal rate and regular rhythm.     Comments: Heart regular.  No murmur. Pulmonary:     Comments: Breath sounds are symmetric to the bases.  No respiratory distress.  Patient endorses severe pain to palpation to the anterior chest wall.  No visible bruising no crepitus.  She also endorses pain to palpation in the CVA on the left. Abdominal:     Comments: Left mid and upper quadrant tender to palpation.  Lower abdomen nontender peer  Musculoskeletal:        General: No swelling or tenderness. Normal range of motion.     Cervical back: Neck supple.     Right lower leg: No edema.     Left lower leg: No edema.  Skin:    General: Skin is warm and dry.  Neurological:     General: No focal deficit present.     Mental Status: She is oriented to person, place, and time.     Cranial Nerves: No cranial nerve deficit.     Coordination: Coordination normal.  Psychiatric:        Mood and Affect: Mood normal.     ED Results / Procedures / Treatments   Labs (all labs ordered are listed, but only abnormal results are displayed) Labs Reviewed  COMPREHENSIVE METABOLIC PANEL - Abnormal; Notable for the following components:      Result Value   Potassium 3.4 (*)    Glucose, Bld 101 (*)    Calcium 8.7 (*)    All other components within normal limits  CBC - Abnormal; Notable for the following components:   Hemoglobin 11.1 (*)    HCT 33.6 (*)    MCV 76.5 (*)    MCH 25.3 (*)    RDW 19.0 (*)    All other components within normal limits  LIPASE, BLOOD  URINALYSIS, ROUTINE W REFLEX MICROSCOPIC  MONONUCLEOSIS SCREEN    EKG None  Radiology DG Chest 2 View  Result Date: 07/02/2020 CLINICAL DATA:  chest pain EXAM: CHEST - 2 VIEW COMPARISON:  Jul 05, 2018 FINDINGS: The heart size and mediastinal contours are within normal limits. No focal consolidation. No pleural effusion. No pneumothorax. The visualized skeletal structures are unremarkable. IMPRESSION: No active cardiopulmonary  disease. Electronically Signed   By: Jul 07, 2018 MD   On: 07/02/2020 00:18   CT CHEST ABDOMEN PELVIS WO CONTRAST  Result Date: 07/02/2020 CLINICAL DATA:  Abdominal trauma. EXAM: CT CHEST, ABDOMEN AND PELVIS WITHOUT CONTRAST TECHNIQUE: Multidetector CT imaging of the chest, abdomen and pelvis was performed  following the standard protocol without IV contrast. COMPARISON:  None. FINDINGS: CT CHEST FINDINGS Cardiovascular: The The heart size is normal. No substantial pericardial effusion. No thoracic aortic aneurysm. Mediastinum/Nodes: No mediastinal lymphadenopathy. Soft tissue in the anterior mediastinum has characteristic appearance for thymic remnant. No evidence for gross hilar lymphadenopathy although assessment is limited by the lack of intravenous contrast on today's study. The esophagus has normal imaging features. There is no axillary lymphadenopathy. Lungs/Pleura: No pneumothorax or pleural effusion. No lung contusion. No suspicious pulmonary nodule or mass. No focal airspace consolidation. Musculoskeletal: No worrisome lytic or sclerotic osseous abnormality. No evidence for left-sided rib fracture. CT ABDOMEN PELVIS FINDINGS Hepatobiliary: No focal abnormality in the liver on this study without intravenous contrast. Nonvisualization of the gallbladder in the gallbladder fossa. There is an elongated somewhat pear-shaped cyst along the falciform ligament versus, less likely, ectopic gallbladder. No intrahepatic or extrahepatic biliary dilation. Pancreas: No focal mass lesion. No dilatation of the main duct. No intraparenchymal cyst. No peripancreatic edema. Spleen: No splenomegaly. No focal mass lesion. Adrenals/Urinary Tract: No adrenal nodule or mass. 4 mm nonobstructing stone noted interpolar right kidney. Left kidney unremarkable. No evidence for hydroureter. The urinary bladder appears normal for the degree of distention. Stomach/Bowel: Stomach is unremarkable. No gastric wall thickening. No  evidence of outlet obstruction. Duodenum is normally positioned as is the ligament of Treitz. No small bowel wall thickening. No small bowel dilatation. The terminal ileum is normal. The appendix is not well visualized, but there is no edema or inflammation in the region of the cecum. No gross colonic mass. No colonic wall thickening. Vascular/Lymphatic: No abdominal aortic aneurysm. There is no gastrohepatic or hepatoduodenal ligament lymphadenopathy. No retroperitoneal or mesenteric lymphadenopathy. No pelvic sidewall lymphadenopathy. Reproductive: No intraperitoneal free fluid. The uterus is unremarkable. There is no adnexal mass. Other: No intraperitoneal free fluid. Musculoskeletal: No worrisome lytic or sclerotic osseous abnormality. IMPRESSION: No acute traumatic abnormality identified in the chest, abdomen, or pelvis. Of note, identification of solid organ injury is decreased on noncontrast imaging. No intraperitoneal free fluid. No evidence for left-sided rib fracture. No pneumothorax or pleural effusion. No lung contusion. 4 mm nonobstructing right renal stone. Electronically Signed   By: Kennith CenterEric  Mansell M.D.   On: 07/02/2020 10:38    Procedures Procedures   Medications Ordered in ED Medications - No data to display  ED Course  I have reviewed the triage vital signs and the nursing notes.  Pertinent labs & imaging results that were available during my care of the patient were reviewed by me and considered in my medical decision making (see chart for details).    MDM Rules/Calculators/A&P                          Patient presents with 2 weeks of pain in her lower chest wall and upper abdomen.  She reports this started after having a hard collision with another basketball player.  It has been persisting and possibly worsening.  Patient is clinically well.  She is alert and nontoxic.  No contusions abrasions, rashes or crepitus to the chest wall.  Vital signs are stable.  No hypoxia, no  tachycardia.  Patient reports a mild febrile illness over a week ago with symptoms resolved.  No signs of pneumonia or pulmonary contusion on CT scan.  CT also does not show any significant abnormalities around the kidney, retroperitoneal space or spleen.  This is a noncontrast scan but 2 weeks post  injury, I would expect some changes if there had been significant injury.  Patient's vital signs also are stable without signs of hypovolemic blood loss.  Baseline CBC is around 12-13.  Hemoglobin today is 11.1.  I would not expect any significant anemia at this level.  At this time I will recommend ibuprofen and Robaxin for ongoing pain from likely chest wall strain.  Other consideration is pleurisy after a resolved febrile illness about a week ago.  Patient is instructed on a follow-up plan with PCP. Final Clinical Impression(s) / ED Diagnoses Final diagnoses:  Chest wall pain  Upper abdominal pain    Rx / DC Orders ED Discharge Orders         Ordered    ibuprofen (ADVIL) 800 MG tablet  3 times daily        07/02/20 1125    methocarbamol (ROBAXIN) 500 MG tablet  Every 6 hours PRN        07/02/20 1125           Arby Barrette, MD 07/02/20 1131

## 2020-07-06 ENCOUNTER — Other Ambulatory Visit: Payer: Self-pay

## 2020-07-07 ENCOUNTER — Other Ambulatory Visit: Payer: Self-pay | Admitting: Podiatry

## 2020-07-07 ENCOUNTER — Ambulatory Visit (INDEPENDENT_AMBULATORY_CARE_PROVIDER_SITE_OTHER): Payer: BC Managed Care – PPO

## 2020-07-07 ENCOUNTER — Other Ambulatory Visit: Payer: Self-pay

## 2020-07-07 ENCOUNTER — Encounter: Payer: Self-pay | Admitting: Podiatry

## 2020-07-07 ENCOUNTER — Ambulatory Visit (INDEPENDENT_AMBULATORY_CARE_PROVIDER_SITE_OTHER): Payer: BC Managed Care – PPO | Admitting: Podiatry

## 2020-07-07 DIAGNOSIS — M7661 Achilles tendinitis, right leg: Secondary | ICD-10-CM

## 2020-07-07 DIAGNOSIS — M25571 Pain in right ankle and joints of right foot: Secondary | ICD-10-CM

## 2020-07-07 DIAGNOSIS — M779 Enthesopathy, unspecified: Secondary | ICD-10-CM

## 2020-07-07 DIAGNOSIS — M722 Plantar fascial fibromatosis: Secondary | ICD-10-CM

## 2020-07-07 DIAGNOSIS — M25471 Effusion, right ankle: Secondary | ICD-10-CM

## 2020-07-07 MED ORDER — MELOXICAM 15 MG PO TABS: 15.0000 mg | ORAL_TABLET | Freq: Every day | ORAL | 0 refills | Status: DC

## 2020-07-07 NOTE — Progress Notes (Signed)
  Subjective:  Patient ID: Sarah Randolph, female    DOB: 1990/10/19,  MRN: [id number]  Chief Complaint  Patient presents with  . Foot Problem    Reports injury in 2013. Feels as if re-injured/irritated via playing basketball. Reports intermittent/constant throbbing/sharp pains. RR:NHAF/BXU.     30 y.o. female presents with the above complaint. History confirmed with patient.   Objective:  Physical Exam: warm, good capillary refill, no trophic changes or ulcerative lesions, normal DP and PT pulses and normal sensory exam. Right Foot: POP right posterior Achilles watershed region. Mild insertion pain. No Achilles dell. Thickening of the Achilles noted at the [facility] region. Mild pain on Ankle ROM. Active DF/PF noted.   No images are attached to the encounter.  Radiographs: X-ray of the right foot: no fracture, dislocation, swelling or degenerative changes noted. Likely thickening of the Achilles noted. Assessment:   1. Tendonitis, Achilles, right   2. Pain and swelling of right ankle    Plan:  Patient was evaluated and treated and all questions answered.  Achilles Tendonitis -XR reviewed with patient -Educated on stretching and icing of the affected limb. -Rx for Meloxicam. Advised on risks, benefits, and alternatives of the medication  -Dispense CAM boot for immobilization  Return in about 1 month (around 08/07/2020) for Tendonitis, Right.

## 2020-07-07 NOTE — Patient Instructions (Signed)

## 2020-08-04 ENCOUNTER — Other Ambulatory Visit: Payer: Self-pay

## 2020-08-04 ENCOUNTER — Ambulatory Visit (INDEPENDENT_AMBULATORY_CARE_PROVIDER_SITE_OTHER): Payer: BC Managed Care – PPO | Admitting: Podiatry

## 2020-08-04 DIAGNOSIS — M25571 Pain in right ankle and joints of right foot: Secondary | ICD-10-CM

## 2020-08-04 DIAGNOSIS — M7661 Achilles tendinitis, right leg: Secondary | ICD-10-CM | POA: Diagnosis not present

## 2020-08-04 DIAGNOSIS — M25471 Effusion, right ankle: Secondary | ICD-10-CM | POA: Diagnosis not present

## 2020-08-04 NOTE — Progress Notes (Signed)
  Subjective:  Patient ID: Sarah Randolph, female    DOB: Oct 03, 1990,  MRN: [id number]  Chief Complaint  Patient presents with   Follow-up    Reports some improvement. Achiness still present when taking boot off and walking too much. Went 2 days in the past 2 weeks w/ out boot. Not taking meloxicam, takes ibuprofen 800mg  when necessary and icing it.    30 y.o. female presents with the above complaint. History confirmed with patient.   Objective:  Physical Exam: warm, good capillary refill, no trophic changes or ulcerative lesions, normal DP and PT pulses and normal sensory exam. Right Foot: POP right posterior Achilles watershed region. Mild insertion pain. No Achilles dell. Thickening of the Achilles noted at the [facility] region. Mild pain on Ankle ROM. Active DF/PF noted.  Assessment:   1. Tendonitis, Achilles, right   2. Pain and swelling of right ankle     Plan:  Patient was evaluated and treated and all questions answered.  Achilles Tendonitis -Improved somewhat -Will start PT for ROM, stretching. She has not been able to tolerate HEP. Referral placed. -Advised to try taking meloxicam even if at half dose.  No follow-ups on file.

## 2020-09-04 ENCOUNTER — Ambulatory Visit: Payer: BC Managed Care – PPO | Admitting: Podiatry

## 2021-06-02 ENCOUNTER — Encounter: Payer: BC Managed Care – PPO | Admitting: Internal Medicine

## 2021-06-27 ENCOUNTER — Ambulatory Visit: Payer: BC Managed Care – PPO

## 2021-06-28 ENCOUNTER — Telehealth: Payer: Self-pay

## 2021-06-28 ENCOUNTER — Ambulatory Visit
Admission: RE | Admit: 2021-06-28 | Discharge: 2021-06-28 | Disposition: A | Discharge status: Home / Self Care | Payer: Self-pay | Source: Ambulatory Visit | Attending: Emergency Medicine | Admitting: Emergency Medicine

## 2021-06-28 VITALS — BP 110/74 | HR 79 | Temp 98.6°F | Resp 18

## 2021-06-28 DIAGNOSIS — L732 Hidradenitis suppurativa: Secondary | ICD-10-CM

## 2021-06-28 HISTORY — DX: Herpesviral infection, unspecified: B00.9

## 2021-06-28 MED ORDER — CLINDAMYCIN PHOSPHATE 1 % EX GEL: Freq: Two times a day (BID) | CUTANEOUS | 0 refills | Status: DC

## 2021-06-28 MED ORDER — DOXYCYCLINE MONOHYDRATE 100 MG PO TABS: 100.0000 mg | ORAL_TABLET | Freq: Two times a day (BID) | ORAL | 1 refills | Status: DC

## 2021-06-28 NOTE — Discharge Instructions (Addendum)
The injection you received in your lesion contained Kenalog is a mild steroid and lidocaine which is a numbing agent.  You should have relief of your pain within the next 10 to 20 minutes and the swelling of the lesion will begin to decrease in the next 6 to 12 hours. ? ?I prescribed you 2 medications to for treatment of your hidradenitis suppurativa.  Please begin doxycycline 1 tablet twice daily for the next 3 months.  This medication will reduce inflammation of your skin as well as treat any bacteria that may be complicating your lesions.  I also provided you with a topical antibiotic called Clindagel, please apply this twice daily.  Once this prescription has run out you do not need to continue it. ? ?I also recommend that you begin washing your body with plain head and shoulders shampoo.  It contains an ingredient called zinc pyrithione that helps to slough away excess dead skin cells that trap bacteria and fungal organisms in the skin that cause HS lesions. ? ?Christiana Pellant is the PA in dermatology that I recommend.  I have included your contact information for you. ? ?Thank you for visiting urgent care today. ?

## 2021-06-28 NOTE — ED Provider Notes (Signed)
?[facility] ? ? ? ?CSN: [id number] ?Arrival date & time: 06/28/21  0806 ?  ? ?HISTORY  ? ?Chief Complaint  ?Patient presents with  ? Abscess  ? ?HPI ?Sarah Randolph is a 31 y.o. female. Patient presents urgent care today complaining of a flareup of her hidradenitis suppurativa.  Patient states she has frequent flareups, has been prescribed antibiotics for this in the past.  Patient states she had multiple lesions drained in the past.  Patient states her lesions typically occur under both of her arms. ? ?The history is provided by the patient.  ?Past Medical History:  ?Diagnosis Date  ? Chronic constipation   ? DIZZINESS 06/11/2009  ? HEAD TRAUMA, MILD 06/10/2008  ? HSV (herpes simplex virus) infection   ? ?Patient Active Problem List  ? Diagnosis Date Noted  ? Hidradenitis 02/20/2019  ? STD exposure 01/16/2015  ? Counseling for initiation of birth control method 10/23/2013  ? Severe menstrual cramps 10/23/2013  ? Allergic reaction   ? Axillary lump 08/11/2011  ? Syncope 10/06/2010  ? Encounter for well adult exam with abnormal findings 09/30/2010  ? ?Past Surgical History:  ?Procedure Laterality Date  ? TONSILLECTOMY  08/2009  ? Dr. Edd Fabian  ? ?OB History   ?No obstetric history on file. ?  ? ?Home Medications   ? ?Prior to Admission medications   ?Medication Sig Start Date End Date Taking? Authorizing Provider  ?azithromycin (ZITHROMAX) 250 MG tablet 2 tab by mouth day 1, then 1 per day 02/20/19   Corwin Levins, MD  ?cefadroxil (DURICEF) 500 MG capsule Take 500 mg by mouth 2 (two) times daily. 03/10/20   [provider]  ?ibuprofen (ADVIL) 600 MG tablet Take 1 tablet (600 mg total) by mouth every 8 (eight) hours as needed. 03/10/20   Linwood Dibbles, MD  ?meloxicam (MOBIC) 15 MG tablet Take 1 tablet (15 mg total) by mouth daily. 07/07/20   Park Liter, DPM  ?methocarbamol (ROBAXIN) 500 MG tablet Take 1 tablet (500 mg total) by mouth every 6 (six) hours as needed for muscle spasms. 07/02/20    Arby Barrette, MD  ?ondansetron (ZOFRAN ODT) 4 MG disintegrating tablet Take 1 tablet (4 mg total) by mouth every 8 (eight) hours as needed for nausea or vomiting. ?Patient not taking: Reported on 07/05/2018 02/13/18   McDonald, Pedro Earls A, PA-C  ?promethazine-dextromethorphan (PROMETHAZINE-DM) 6.25-15 MG/5ML syrup Take 5 mLs by mouth 4 (four) times daily as needed for cough. ?Patient not taking: Reported on 07/05/2018 02/13/18   Frederik Pear A, PA-C  ? ? ?Family History ?Family History  ?Problem Relation Age of Onset  ? Hypertension Other   ? ?Social History ?Social History  ? ?Tobacco Use  ? Smoking status: Some Days  ?  Types: Cigars  ? Smokeless tobacco: Never  ?Vaping Use  ? Vaping Use: Never used  ?Substance Use Topics  ? Alcohol use: Yes  ?  Comment: occ  ? Drug use: No  ? ?Allergies   ?Patient has no known allergies. ? ?Review of Systems ?Review of Systems ?Pertinent findings noted in history of present illness.  ? ?Physical Exam ?Triage Vital Signs ?ED Triage Vitals  ?Enc Vitals Group  ?   BP 12/11/20 0827 (!) 147/82  ?   Pulse Rate 12/11/20 0827 72  ?   Resp 12/11/20 0827 18  ?   Temp 12/11/20 0827 98.3 ?F (36.8 ?C)  ?   Temp Source 12/11/20 0827 Oral  ?   SpO2 12/11/20  0827 98 %  ?   Weight --   ?   Height --   ?   Head Circumference --   ?   Peak Flow --   ?   Pain Score 12/11/20 0826 5  ?   Pain Loc --   ?   Pain Edu? --   ?   Excl. in GC? --   ?No data found. ? ?Updated Vital Signs ?BP 110/74 (BP Location: Left Arm)   Pulse 79   Temp 98.6 ?F (37 ?C) (Oral)   Resp 18   LMP 06/01/2021 (Approximate)   SpO2 97%  ? ?Physical Exam ? ?Visual Acuity ?Right Eye Distance:   ?Left Eye Distance:   ?Bilateral Distance:   ? ?Right Eye Near:   ?Left Eye Near:    ?Bilateral Near:    ? ?UC Couse / Diagnostics / Procedures:  ?  ?EKG ? ?Radiology ?No results found. ? ?Procedures ?Incision and Drainage ? ?Date/Time: 06/28/2021 9:18 AM ?Performed by: Theadora Rama Scales, PA-C ?Authorized by: Theadora Rama Scales, PA-C   ? ?Consent:  ?  Consent obtained:  Verbal ?  Consent given by:  Patient ?  Risks, benefits, and alternatives were discussed: yes   ?  Risks discussed:  Bleeding, pain and infection ?  Alternatives discussed:  No treatment, delayed treatment and alternative treatment ?Universal protocol:  ?  Procedure explained and questions answered to patient or proxy's satisfaction: yes   ?  Patient identity confirmed:  Verbally with patient and arm band ?Location:  ?  Type:  Abscess ?  Size:  2 ?  Location:  Trunk ?  Trunk location: Right axilla. ?Pre-procedure details:  ?  Skin preparation:  Povidone-iodine ?Procedure details:  ?  Incision type: HS lesion was injected with a solution of 0.75 mL lidocaine 1% and 0.25 mL Kenalog 40 mg/mL. ?  Drainage characteristics: Scant amount of purulent drainage was returned after injection. ?Post-procedure details:  ?  Procedure completion:  Tolerated ?Comments:  ?   Lesion was loosely covered with gauze to catch any further drainage from the lesion. (including critical care time) ? ?UC Diagnoses / Final Clinical Impressions(s)   ?I have reviewed the triage vital signs and the nursing notes. ? ?Pertinent labs & imaging results that were available during my care of the patient were reviewed by me and considered in my medical decision making (see chart for details).   ? ?Final diagnoses:  ?Hidradenitis suppurativa of right axilla  ? ?Hidradenitis suppurativa lesion injected with a mixture of Kenalog and lidocaine.  Patient provided with a prescription for clindamycin gel and doxycycline for 3 months.  Patient given the name of a local dermatologist to see for further management of this chronic condition. ? ?ED Prescriptions   ? ? Medication Sig Dispense Auth. Provider  ? doxycycline (ADOXA) 100 MG tablet Take 1 tablet (100 mg total) by mouth 2 (two) times daily. 180 tablet Theadora Rama Scales, PA-C  ? clindamycin (CLINDAGEL) 1 % gel Apply topically 2 (two) times daily. 30 g Theadora Rama  Scales, PA-C  ? ?  ? ?PDMP not reviewed this encounter. ? ?Pending results:  ?Labs Reviewed - No data to display ? ?Medications Ordered in UC: ?Medications - No data to display ? ?Disposition Upon Discharge:  ?Condition: stable for discharge home ?Home: take medications as prescribed; routine discharge instructions as discussed; follow up as advised. ? ?Patient presented with an acute illness with associated systemic symptoms and significant discomfort requiring urgent management.  In my opinion, this is a condition that a prudent lay person (someone who possesses an average knowledge of health and medicine) may potentially expect to result in complications if not addressed urgently such as respiratory distress, impairment of bodily function or dysfunction of bodily organs.  ? ?Routine symptom specific, illness specific and/or disease specific instructions were discussed with the patient and/or caregiver at length.  ? ?As such, the patient has been evaluated and assessed, work-up was performed and treatment was provided in alignment with urgent care protocols and evidence based medicine.  Patient/parent/caregiver has been advised that the patient may require follow up for further testing and treatment if the symptoms continue in spite of treatment, as clinically indicated and appropriate. ? ?Patient/parent/caregiver has been advised [facility] if no better; to PCP or the Emergency Department if new signs and symptoms develop, or if the current signs or symptoms continue to change or worsen for further workup, evaluation and treatment as clinically indicated and appropriate ? ?The patient will follow up with their current PCP if and as advised. If the patient does not currently have a PCP we will assist them in obtaining one.  ? ?The patient may need specialty follow up if the symptoms continue, in spite of conservative treatment and management, for further workup, evaluation, consultation and  treatment as clinically indicated and appropriate. ? ? ?Patient/parent/caregiver verbalized understanding and agreement of plan as discussed.  All questions were addressed during visit.  Please see discharge i

## 2021-06-28 NOTE — ED Triage Notes (Addendum)
Patient states she has an abscess to her right axillary region that she noticed about 4 days ago. Pt states she has been taking Tylenol at home for pain relief which is somewhat helpful. ?

## 2021-06-30 ENCOUNTER — Other Ambulatory Visit: Payer: Self-pay

## 2021-06-30 ENCOUNTER — Encounter (HOSPITAL_COMMUNITY): Payer: Self-pay

## 2021-06-30 ENCOUNTER — Emergency Department (HOSPITAL_COMMUNITY)
Admission: EM | Admit: 2021-06-30 | Discharge: 2021-06-30 | Discharge status: Against Medical Advice | Payer: Self-pay | Attending: Emergency Medicine | Admitting: Emergency Medicine

## 2021-06-30 DIAGNOSIS — L732 Hidradenitis suppurativa: Secondary | ICD-10-CM | POA: Insufficient documentation

## 2021-06-30 LAB — PREGNANCY, URINE: Preg Test, Ur: NEGATIVE

## 2021-06-30 MED ORDER — KETOROLAC TROMETHAMINE 60 MG/2ML IM SOLN
30.0000 mg | Freq: Once | INTRAMUSCULAR | Status: DC
  Filled 2021-06-30: qty 2

## 2021-06-30 NOTE — ED Triage Notes (Signed)
Pt reports with an HS flare up to her right axilla x 1 week ago. Pt reports going to UC on Monday but states that the pain is still there.  ?

## 2021-06-30 NOTE — ED Notes (Signed)
This writer went to pt room to administer medication. PA exited room and stated pt was not in it. No belongings noted in room. Bathrooms empty. ?

## 2021-06-30 NOTE — ED Provider Notes (Signed)
?[facility]-EMER[facility]ENCY DEPT ?Provider Note ? ? ?CSN: [id number] ?Arrival date & time: 06/30/21  0202 ? ?  ? ?History ? ?Chief Complaint  ?Patient presents with  ? HS   ? ? ?Sarah Randolph is a 31 y.o. female with PMHx hydradenitis suppurativa who presents to the ED today with complaint of gradual onset, constant, worsening, pain to R axilla. Per chart review pt was seen at UC 2 days ago for same - she had lesion injected with kenalog and lidocaine with scant amount of purulent drainage returned after injection. She was discharged with a 3 months course of Doxycycline and clindamycin gel and given referral to dermatology for further eval. Pt reports that since that time she has had worsening pain to the area - she states she was told she would have no pain after the injection. She has been taking Ibuprofen and Tylenol without any relief. She states anytime she moves her arm she has pain to the area. No other complaints at this time.  ? ?The history is provided by the patient and medical records.  ? ?  ? ?Home Medications ?Prior to Admission medications   ?Medication Sig Start Date End Date Taking? Authorizing Provider  ?clindamycin (CLINDAGEL) 1 % gel Apply topically 2 (two) times daily. ?Patient not taking: Reported on 06/30/2021 06/28/21   Theadora Rama Scales, PA-C  ?doxycycline (ADOXA) 100 MG tablet Take 1 tablet (100 mg total) by mouth 2 (two) times daily. ?Patient not taking: Reported on 06/30/2021 06/28/21 12/25/21  Theadora Rama Scales, PA-C  ?   ? ?Allergies    ?Patient has no known allergies.   ? ?Review of Systems   ?Review of Systems  ?Constitutional:  Negative for chills and fever.  ?Musculoskeletal:  Positive for arthralgias.  ?Skin:   ?     + abscess to R axilla  ?All other systems reviewed and are negative. ? ?Physical Exam ?Updated Vital Signs ?BP 132/89   Pulse (!) 102   Temp 98.1 ?F (36.7 ?C)   Resp 18   Ht 6\' 2"  (1.88 m)   Wt 72.6 kg   LMP 06/01/2021 (Approximate)    SpO2 100%   BMI 20.54 kg/m?  ?Physical Exam ?Vitals and nursing note reviewed.  ?Constitutional:   ?   Appearance: She is not ill-appearing.  ?HENT:  ?   Head: Normocephalic and atraumatic.  ?Eyes:  ?   Conjunctiva/sclera: Conjunctivae normal.  ?Cardiovascular:  ?   Rate and Rhythm: Normal rate and regular rhythm.  ?Pulmonary:  ?   Effort: Pulmonary effort is normal.  ?   Breath sounds: Normal breath sounds.  ?Skin: ?   General: Skin is warm and dry.  ?   Coloration: Skin is not jaundiced.  ?   Comments: Abscess measuring 2 x 2 cm to R axilla with mild active purulent drainage. No erythema however significant TTP surrounding the area.   ?Neurological:  ?   Mental Status: She is alert.  ? ? ?ED Results / Procedures / Treatments   ?Labs ?(all labs ordered are listed, but only abnormal results are displayed) ?Labs Reviewed  ?PREGNANCY, URINE  ?POC URINE PREG, ED  ? ? ?EKG ?None ? ?Radiology ?No results found. ? ?Procedures ?Procedures  ? ? ?Medications Ordered in ED ?Medications  ?ketorolac (TORADOL) injection 30 mg (has no administration in time range)  ? ? ?ED Course/ Medical Decision Making/ A&P ?  ?                        ?  Medical Decision Making ?31 year old female who presents to the ED today with complaint of pain to right axilla secondary to Kenalog and lidocaine injection to hidradenitis suppurativa lesion 2 days ago at urgent care.  On arrival to the ED today patient's heart rate is slightly elevated, suspect secondary to pain.  Remainder vitals are unremarkable.  She is noted to have a 2 x 2 centimeter abscess to right axilla with mild amount of active purulent drainage.  She has significant tenderness surrounding area.  There are no signs of cellulitis at this time.  Do not feel she requires additional incision at this time as she has active drainage.  Will provide Toradol injection with reevaluation however we will plan for urine pregnancy test prior patient reports last normal menstrual cycle  approximately 1 month ago. May benefit from short course of pain meds to go home with however pt did drive herself to the ED today. She has yet to pick up Rx abx that she was prescribed at [facility].  ? ?Preg negative. Toradol provided. Pt discharged home with short course of pains meds. She is encouraged to follow up with dermatologist provided to  here at urgent care visit and to pick up Rx abx and begin taking. She is in agreement with plan and stable for discharge home.  ? ?Upon updating pt on plan she is no longer in her room. Unable to locate at this time. Suspect pt eloped.  ? ?Amount and/or Complexity of Data Reviewed ?Labs: ordered. ? ?Risk ?Prescription drug management. ? ? ? ? ? ? ? ? ? ?Final Clinical Impression(s) / ED Diagnoses ?Final diagnoses:  ?Hidradenitis suppurativa  ? ? ?Rx / DC Orders ?ED Discharge Orders   ? ? None  ? ?  ? ? ?  ?Tanda Rockers, PA-C ?06/30/21 7846 ? ?  ?Shon Baton, MD ?06/30/21 [phone] ? ?

## 2021-09-17 ENCOUNTER — Ambulatory Visit
Admission: EM | Admit: 2021-09-17 | Discharge: 2021-09-17 | Disposition: A | Discharge status: Home / Self Care | Payer: Self-pay | Attending: Urgent Care | Admitting: Urgent Care

## 2021-09-17 ENCOUNTER — Encounter: Payer: Self-pay | Admitting: Emergency Medicine

## 2021-09-17 DIAGNOSIS — Z202 Contact with and (suspected) exposure to infections with a predominantly sexual mode of transmission: Secondary | ICD-10-CM | POA: Insufficient documentation

## 2021-09-17 DIAGNOSIS — Z7251 High risk heterosexual behavior: Secondary | ICD-10-CM | POA: Insufficient documentation

## 2021-09-17 MED ORDER — DOXYCYCLINE HYCLATE 100 MG PO CAPS: 100.0000 mg | ORAL_CAPSULE | Freq: Two times a day (BID) | ORAL | 0 refills | Status: DC

## 2021-09-17 NOTE — ED Triage Notes (Signed)
Pt has known exposure to chlamydia two weeks ago and has concerns for both vaginal and oral transmission. No sxs. Does not want blood work

## 2021-09-17 NOTE — ED Provider Notes (Signed)
  [facility]   MRN: [id number] DOB: 01/12/1991  Subjective:   Sarah Randolph is a 31 y.o. female presenting for exposure to chlamydia.  Patient had unprotected oral sex 2 weeks ago.  It turns out her sex partner tested positive for chlamydia. Denies fever, n/v, abdominal pain, pelvic pain, rashes, dysuria, urinary frequency, hematuria, vaginal discharge.    No current facility-administered medications for this encounter.  Current Outpatient Medications:    clindamycin (CLINDAGEL) 1 % gel, Apply topically 2 (two) times daily. (Patient not taking: Reported on 06/30/2021), Disp: 30 g, Rfl: 0   doxycycline (ADOXA) 100 MG tablet, Take 1 tablet (100 mg total) by mouth 2 (two) times daily. (Patient not taking: Reported on 06/30/2021), Disp: 180 tablet, Rfl: 1   No Known Allergies  Past Medical History:  Diagnosis Date   Chronic constipation    DIZZINESS 06/11/2009   HEAD TRAUMA, MILD 06/10/2008   HSV (herpes simplex virus) infection      Past Surgical History:  Procedure Laterality Date   TONSILLECTOMY  08/2009   Dr. Edd Fabian    Family History  Problem Relation Age of Onset   Hypertension Other     Social History   Tobacco Use   Smoking status: Some Days    Types: Cigars   Smokeless tobacco: Never  Vaping Use   Vaping Use: Never used  Substance Use Topics   Alcohol use: Yes    Comment: occ   Drug use: No    ROS   Objective:   Vitals: BP (!) 167/98   Pulse (!) 113   Temp 99.8 F (37.7 C)   Resp 20   SpO2 99%   Physical Exam Constitutional:      General: She is not in acute distress.    Appearance: Normal appearance. She is well-developed. She is not ill-appearing, toxic-appearing or diaphoretic.  HENT:     Head: Normocephalic and atraumatic.     Nose: Nose normal.     Mouth/Throat:     Mouth: Mucous membranes are moist.     Pharynx: No pharyngeal swelling, oropharyngeal exudate, posterior oropharyngeal erythema or uvula  swelling.     Tonsils: No tonsillar exudate or tonsillar abscesses. 0 on the right. 0 on the left.  Eyes:     General: No scleral icterus.       Right eye: No discharge.        Left eye: No discharge.     Extraocular Movements: Extraocular movements intact.  Cardiovascular:     Rate and Rhythm: Normal rate.  Pulmonary:     Effort: Pulmonary effort is normal.  Skin:    General: Skin is warm and dry.  Neurological:     General: No focal deficit present.     Mental Status: She is alert and oriented to person, place, and time.  Psychiatric:        Mood and Affect: Mood normal.        Behavior: Behavior normal.     Assessment and Plan :   PDMP not reviewed this encounter.  1. Unprotected sex   2. Chlamydia contact, untreated    Given her exposure, I advised empiric treatment for chlamydia with doxycycline.  Labs pending, will treat as appropriate otherwise.  Patient did not want to get vaginal swab as well. Counseled patient on potential for adverse effects with medications prescribed/recommended today, ER and return-to-clinic precautions discussed, patient verbalized understanding.    Wallis Bamberg, [place] 09/17/21 1952

## 2021-09-17 NOTE — Discharge Instructions (Addendum)
Avoid all forms of sexual intercourse (oral, vaginal, anal) for the next 7 days to avoid spreading/reinfecting or at least until we can see what kinds of infection results are positive.  Abstaining for 2 weeks would be better but at least 1 week is required.  We will let you know about your test results from the swab we did today and if you need any prescriptions for antibiotics or changes to your treatment from today.  

## 2021-09-20 LAB — CYTOLOGY, (ORAL, ANAL, URETHRAL) ANCILLARY ONLY
Chlamydia: NEGATIVE
Comment: NEGATIVE
Comment: NEGATIVE
Comment: NORMAL
Neisseria Gonorrhea: NEGATIVE
Trichomonas: NEGATIVE

## 2021-09-20 LAB — CERVICOVAGINAL ANCILLARY ONLY
Chlamydia: NEGATIVE
Comment: NEGATIVE
Comment: NEGATIVE
Comment: NORMAL
Neisseria Gonorrhea: NEGATIVE
Trichomonas: NEGATIVE

## 2022-02-15 IMAGING — CT CT CHEST-ABD-PELV W/O CM
2 of 5 series · 12 of 46 positions shown, 14 images · non-contrast
Comparison: None.

CLINICAL DATA: Abdominal trauma.

EXAM:
CT CHEST, ABDOMEN AND PELVIS WITHOUT CONTRAST
TECHNIQUE: Multidetector CT imaging of the chest, abdomen and pelvis was
performed following the standard protocol without IV contrast.

[Series 2: cap without · axial · non-contrast · 0.66mm/px · z∈[+1041,+1561]mm · 9 of 130 slices shown, 11 images]
[im 13/130  soft-tissue]
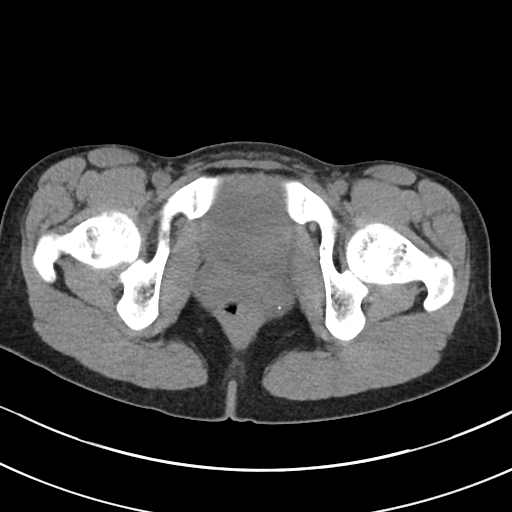
[im 13/130  bone]
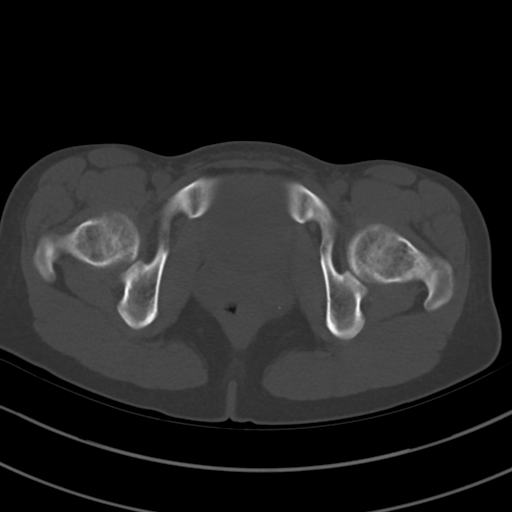
[im 26/130  soft-tissue]
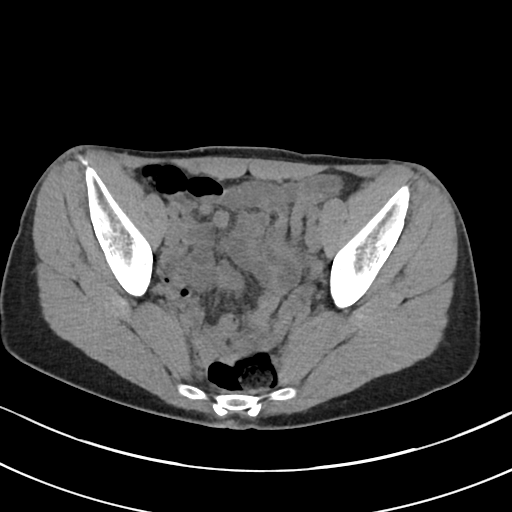
[im 39/130  soft-tissue]
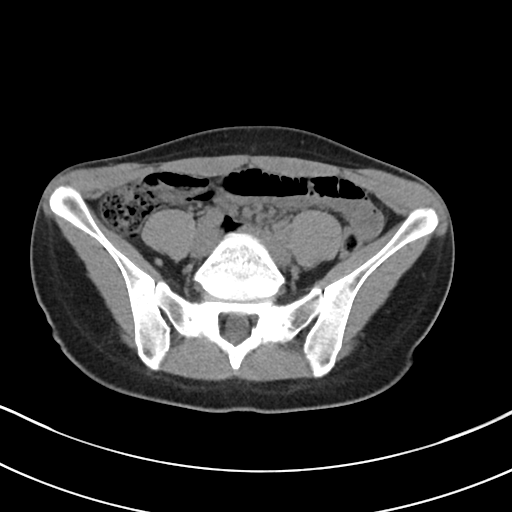
[im 52/130  soft-tissue]
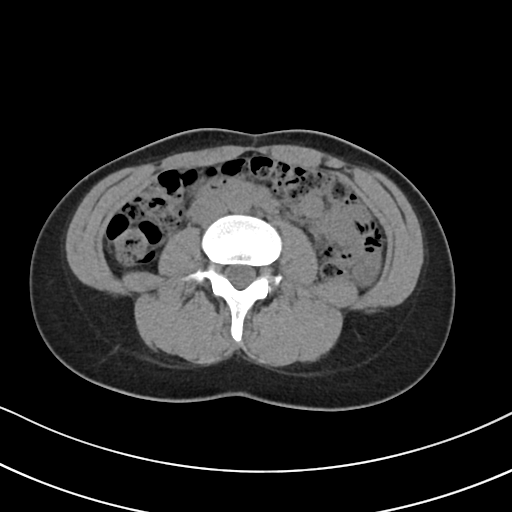
[im 65/130  soft-tissue]
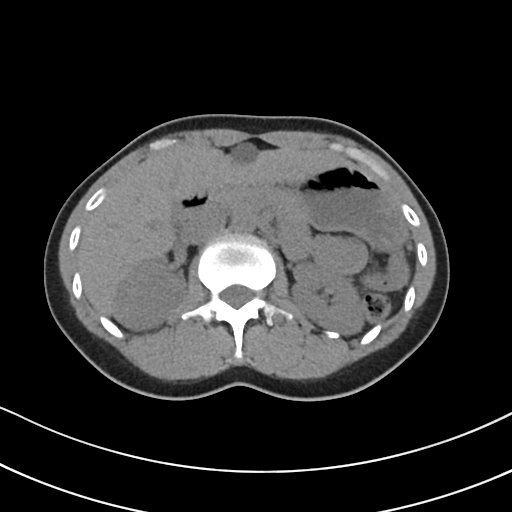
[im 78/130  soft-tissue]
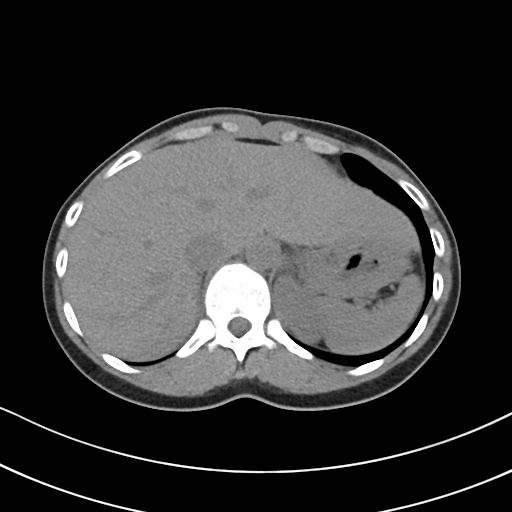
[im 91/130  soft-tissue]
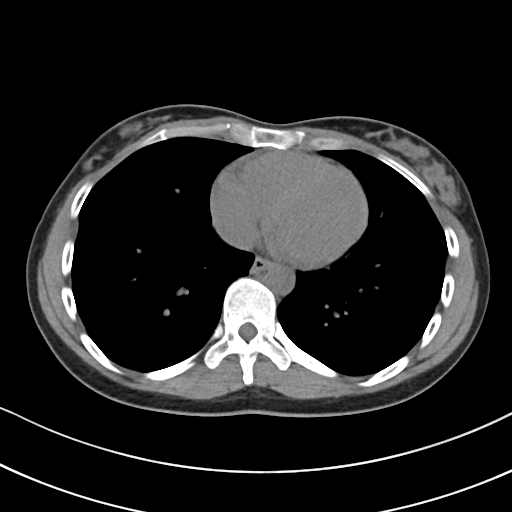
[im 104/130  soft-tissue]
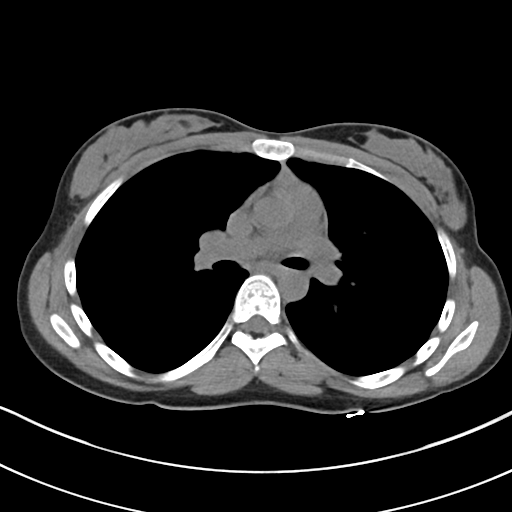
[im 117/130  soft-tissue]
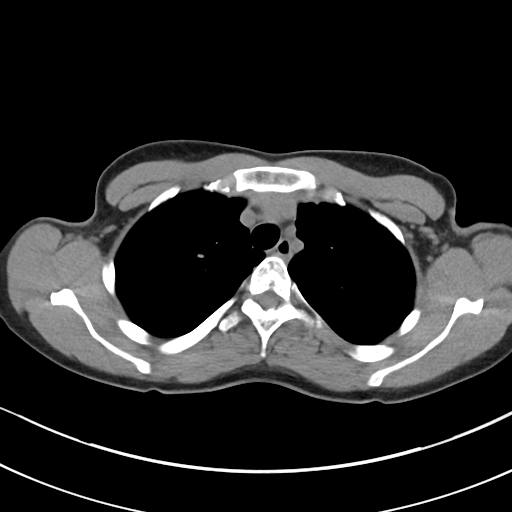
[im 117/130  bone]
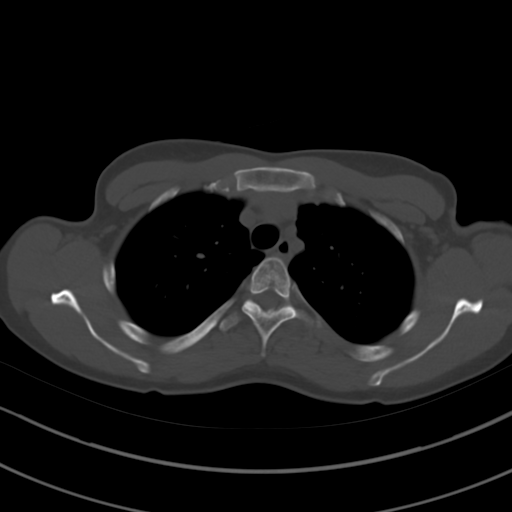

[Series 6: coronal · coronal · 0.69mm/px · 3 of 77 slices shown]
[im 26/77  soft-tissue]
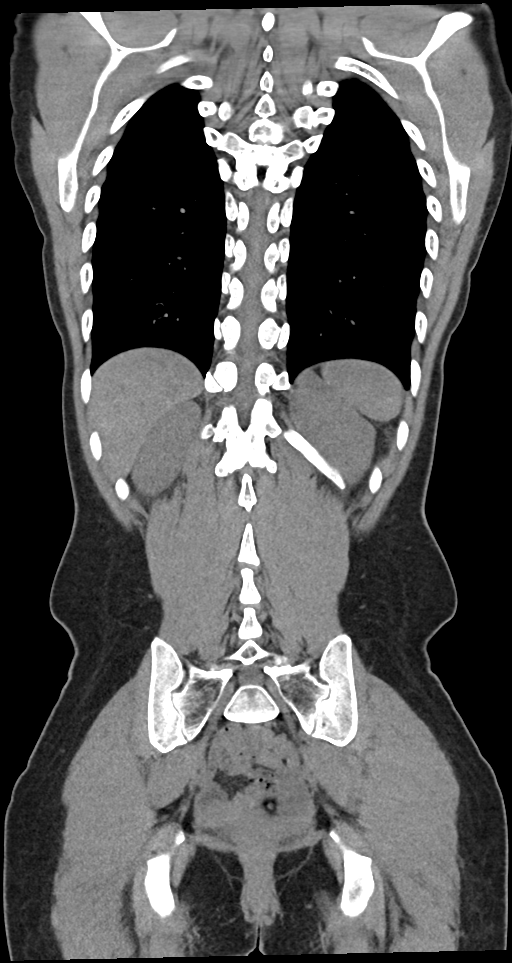
[im 34/77  soft-tissue]
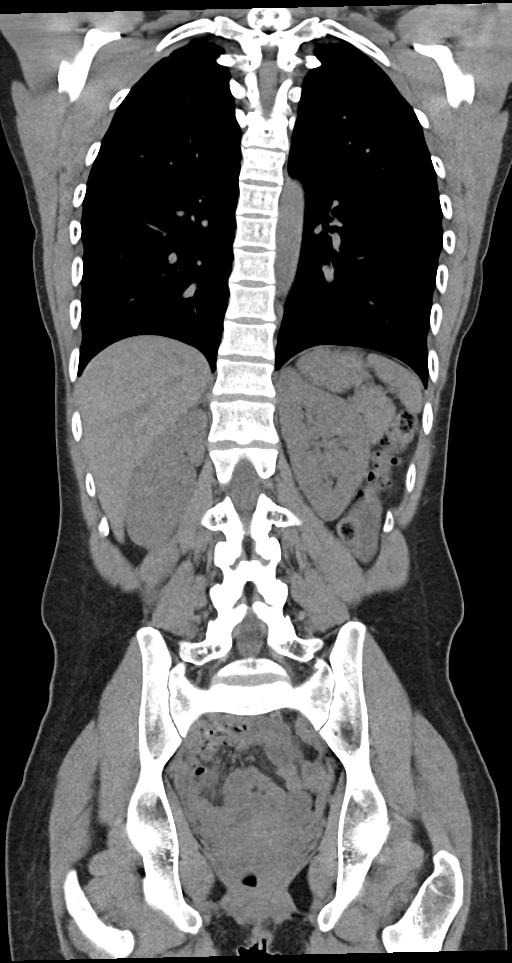
[im 43/77  soft-tissue]
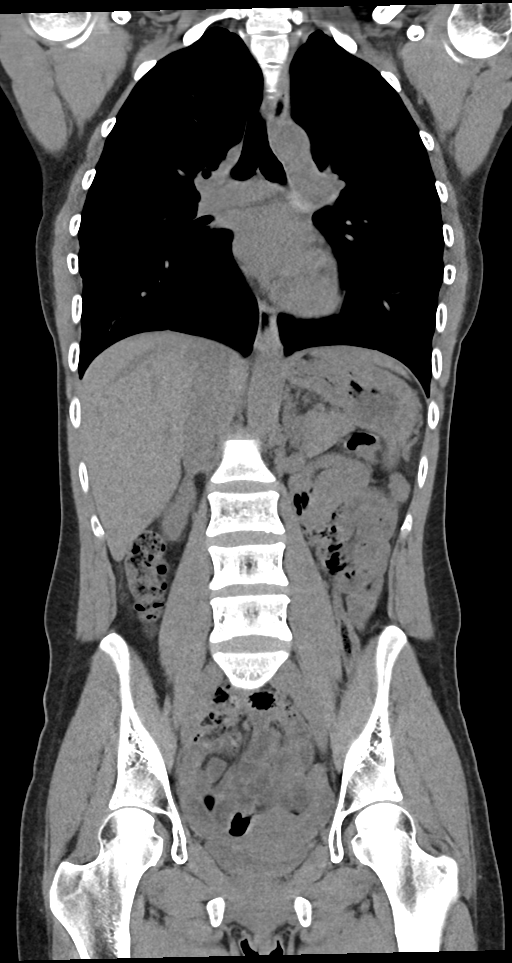

[12 of 46 positions shown; findings below may reference images not displayed]

FINDINGS: CT CHEST FINDINGS

Cardiovascular: The The heart size is normal. No substantial
pericardial effusion. No thoracic aortic aneurysm.

Mediastinum/Nodes: No mediastinal lymphadenopathy. Soft tissue in
the anterior mediastinum has characteristic appearance for thymic
remnant. No evidence for gross hilar lymphadenopathy although
assessment is limited by the lack of intravenous contrast on today's
study. The esophagus has normal imaging features. There is no
axillary lymphadenopathy.

Lungs/Pleura: No pneumothorax or pleural effusion. No lung
contusion. No suspicious pulmonary nodule or mass. No focal airspace
consolidation.

Musculoskeletal: No worrisome lytic or sclerotic osseous
abnormality. No evidence for left-sided rib fracture.

CT ABDOMEN PELVIS FINDINGS

Hepatobiliary: No focal abnormality in the liver on this study
without intravenous contrast. Nonvisualization of the gallbladder in
the gallbladder fossa. There is an elongated somewhat pear-shaped
cyst along the falciform ligament versus, less likely, ectopic
gallbladder. No intrahepatic or extrahepatic biliary dilation.

Pancreas: No focal mass lesion. No dilatation of the main duct. No
intraparenchymal cyst. No peripancreatic edema.

Spleen: No splenomegaly. No focal mass lesion.

Adrenals/Urinary Tract: No adrenal nodule or mass. 4 mm
nonobstructing stone noted interpolar right kidney. Left kidney
unremarkable. No evidence for hydroureter. The urinary bladder
appears normal for the degree of distention.

Stomach/Bowel: Stomach is unremarkable. No gastric wall thickening.
No evidence of outlet obstruction. Duodenum is normally positioned
as is the ligament of Treitz. No small bowel wall thickening. No
small bowel dilatation. The terminal ileum is normal. The appendix
is not well visualized, but there is no edema or inflammation in the
region of the cecum. No gross colonic mass. No colonic wall
thickening.

Vascular/Lymphatic: No abdominal aortic aneurysm. There is no
gastrohepatic or hepatoduodenal ligament lymphadenopathy. No
retroperitoneal or mesenteric lymphadenopathy. No pelvic sidewall
lymphadenopathy.

Reproductive: No intraperitoneal free fluid. The uterus is
unremarkable. There is no adnexal mass.

Other: No intraperitoneal free fluid.

Musculoskeletal: No worrisome lytic or sclerotic osseous
abnormality.
IMPRESSION: No acute traumatic abnormality identified in the chest, abdomen, or
pelvis. Of note, identification of solid organ injury is decreased
on noncontrast imaging.

No intraperitoneal free fluid.

No evidence for left-sided rib fracture. No pneumothorax or pleural
effusion. No lung contusion.

4 mm nonobstructing right renal stone.

## 2022-02-15 IMAGING — DX DG CHEST 2V
2 series · 2 of 2 positions shown · non-contrast
Comparison: July 05, 2018

CLINICAL DATA: chest pain

EXAM:
CHEST - 2 VIEW

[chest pa]
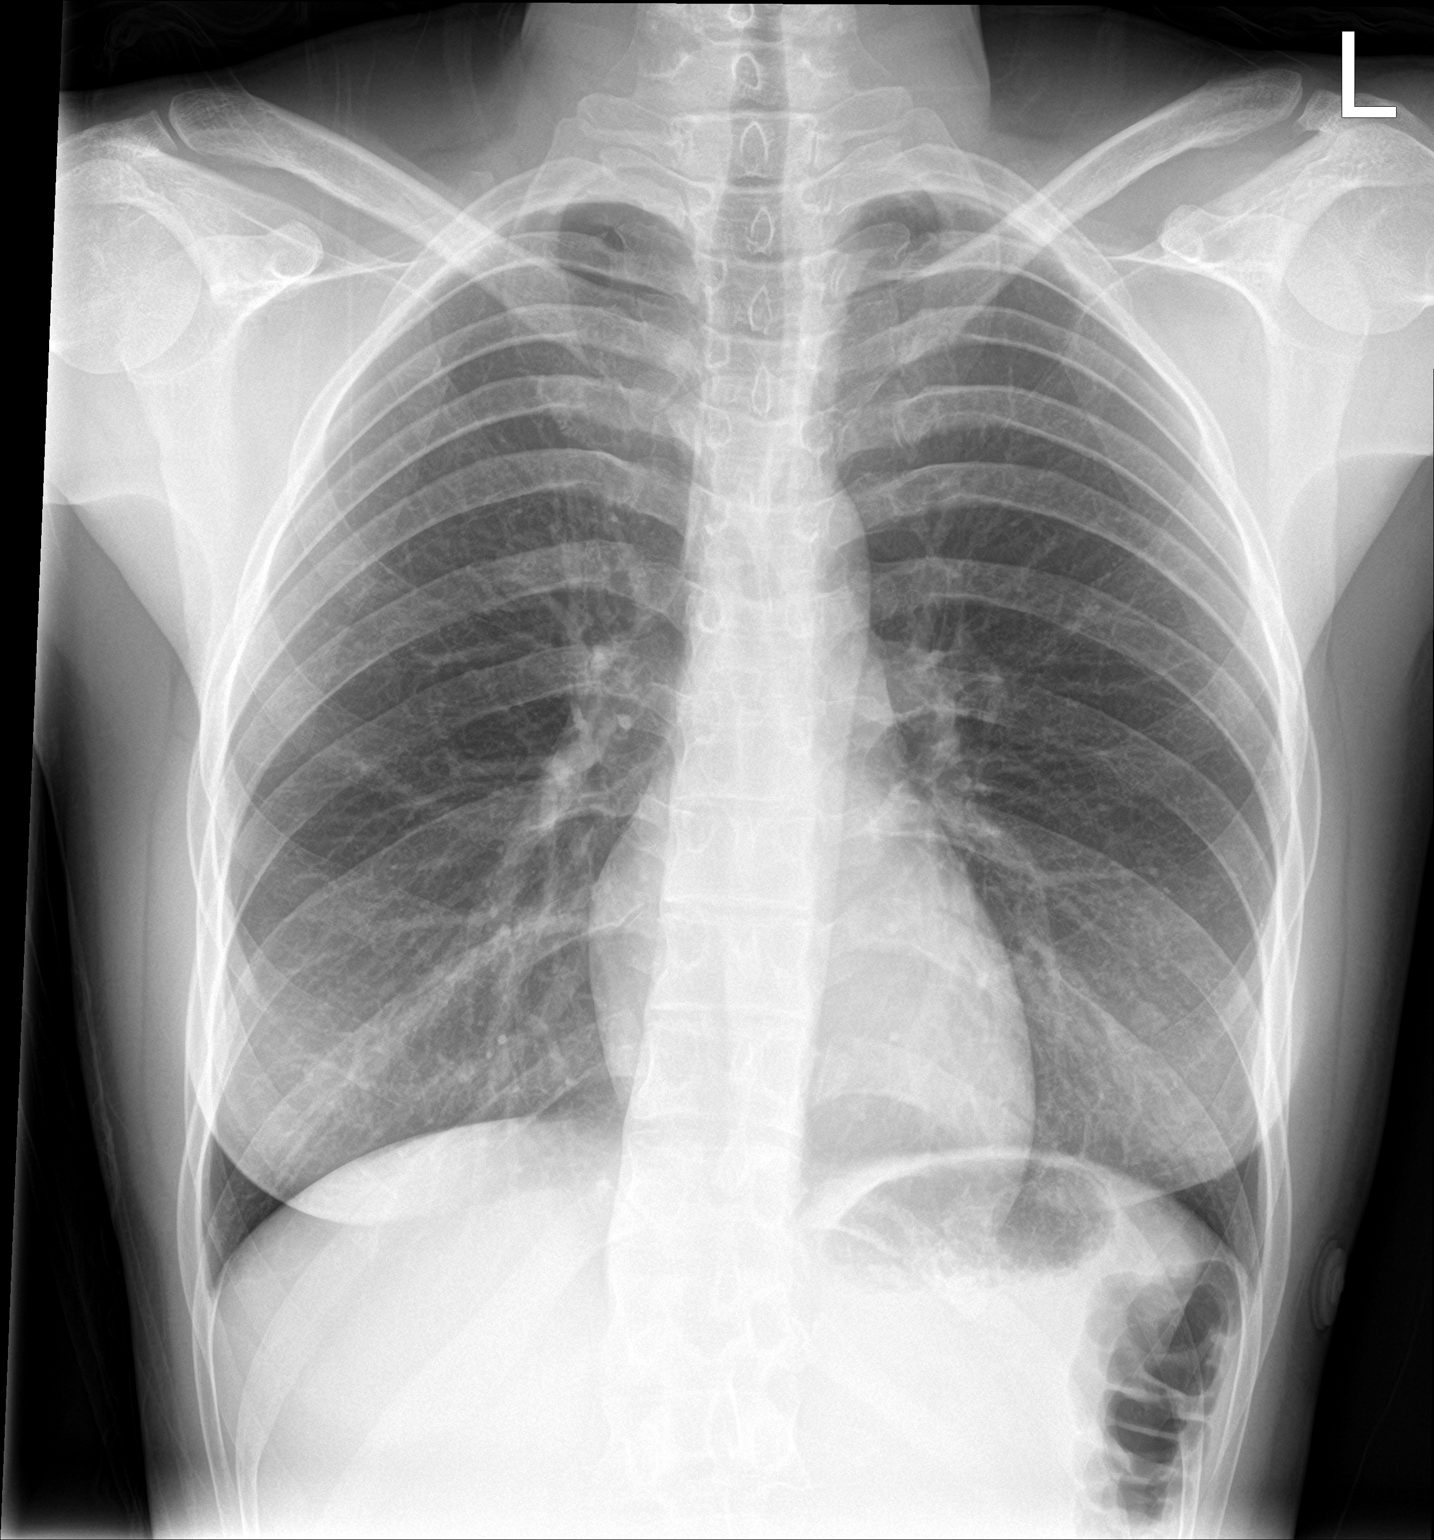

[chest lat]
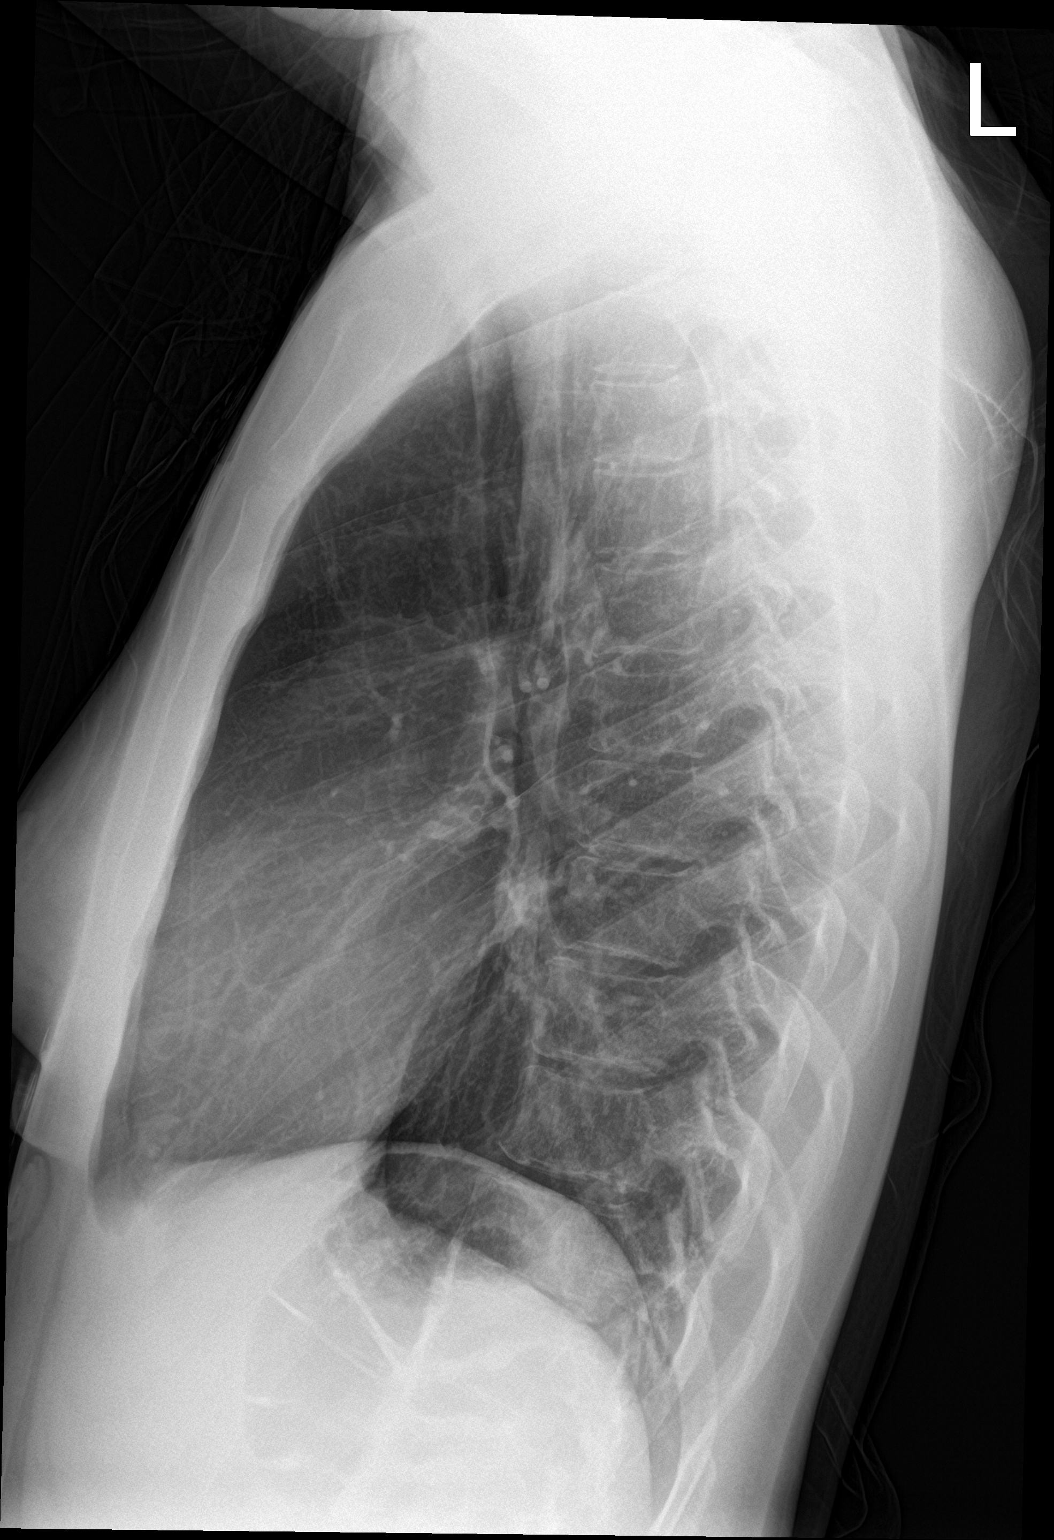

[2 of 2 positions shown; findings below may reference images not displayed]

FINDINGS: The heart size and mediastinal contours are within normal limits. No
focal consolidation. No pleural effusion. No pneumothorax. The
visualized skeletal structures are unremarkable.
IMPRESSION: No active cardiopulmonary disease.

## 2022-12-23 ENCOUNTER — Encounter: Payer: Self-pay | Admitting: *Deleted

## 2022-12-23 ENCOUNTER — Other Ambulatory Visit: Payer: Self-pay

## 2022-12-23 ENCOUNTER — Ambulatory Visit
Admission: EM | Admit: 2022-12-23 | Discharge: 2022-12-23 | Disposition: A | Discharge status: Home / Self Care | Payer: Medicaid Other | Attending: Internal Medicine | Admitting: Internal Medicine

## 2022-12-23 DIAGNOSIS — N764 Abscess of vulva: Secondary | ICD-10-CM | POA: Diagnosis not present

## 2022-12-23 HISTORY — DX: Hidradenitis suppurativa: L73.2

## 2022-12-23 MED ORDER — SULFAMETHOXAZOLE-TRIMETHOPRIM 800-160 MG PO TABS: 1.0000 | ORAL_TABLET | Freq: Two times a day (BID) | ORAL | 0 refills | Status: DC

## 2022-12-23 NOTE — Discharge Instructions (Signed)
I have prescribed you an antibiotic for concern for abscess.  I do have a concern for Bartholin's gland abscess which needs to be evaluated by gynecology as soon as possible.  I have placed a referral for this.  If this worsens or persists over the weekend, please go to the emergency department for evaluation.

## 2022-12-23 NOTE — ED Provider Notes (Addendum)
[facility]    CSN: [id number] Arrival date & time: 12/23/22  1625      History   Chief Complaint Chief Complaint  Patient presents with   Vaginal Pain    HPI Sarah Randolph is a 32 y.o. female.   Patient presents with concern of abscess in the vaginal area that she noticed about 2 days ago.  She does have history of hidradenitis suppurativa so has abscesses in various areas of her body intermittently.  Denies any drainage from the area.  Last menstrual cycle was 12/04/2022. Denies any fever.   Vaginal Pain    Past Medical History:  Diagnosis Date   Chronic constipation    DIZZINESS 06/11/2009   HEAD TRAUMA, MILD 06/10/2008   Hidradenitis    HSV (herpes simplex virus) infection     Patient Active Problem List   Diagnosis Date Noted   Hidradenitis 02/20/2019   STD exposure 01/16/2015   Counseling for initiation of birth control method 10/23/2013   Severe menstrual cramps 10/23/2013   Allergic reaction    Axillary lump 08/11/2011   Syncope 10/06/2010   Encounter for well adult exam with abnormal findings 09/30/2010    Past Surgical History:  Procedure Laterality Date   TONSILLECTOMY  08/2009   Dr. Edd Fabian    OB History   No obstetric history on file.      Home Medications    Prior to Admission medications   Medication Sig Start Date End Date Taking? Authorizing Provider  sulfamethoxazole-trimethoprim (BACTRIM DS) 800-160 MG tablet Take 1 tablet by mouth 2 (two) times daily for 7 days. 12/23/22 12/30/22 Yes Gustavus Bryant, FNP    Family History Family History  Problem Relation Age of Onset   Hypertension Other     Social History Social History   Tobacco Use   Smoking status: Some Days    Types: Cigars   Smokeless tobacco: Never  Vaping Use   Vaping status: Never Used  Substance Use Topics   Alcohol use: Yes    Comment: occ   Drug use: No     Allergies   Patient has no known allergies.   Review of  Systems Review of Systems Per HPI  Physical Exam Triage Vital Signs ED Triage Vitals  Encounter Vitals Group     BP 12/23/22 1709 128/86     Systolic BP Percentile --      Diastolic BP Percentile --      Pulse Rate 12/23/22 1709 89     Resp 12/23/22 1709 18     Temp 12/23/22 1709 98.5 F (36.9 C)     Temp Source 12/23/22 1709 Oral     SpO2 12/23/22 1709 98 %     Weight --      Height --      Head Circumference --      Peak Flow --      Pain Score 12/23/22 1705 4     Pain Loc --      Pain Education --      Exclude from Growth Chart --    No data found.  Updated Vital Signs BP 128/86 (BP Location: Left Arm)   Pulse 89   Temp 98.5 F (36.9 C) (Oral)   Resp 18   LMP 12/04/2022   SpO2 98%   Visual Acuity Right Eye Distance:   Left Eye Distance:   Bilateral Distance:    Right Eye Near:   Left Eye Near:    Bilateral  Near:     Physical Exam Exam conducted with a chaperone present.  Constitutional:      General: She is not in acute distress.    Appearance: Normal appearance. She is not toxic-appearing or diaphoretic.  HENT:     Head: Normocephalic and atraumatic.  Eyes:     Extraocular Movements: Extraocular movements intact.     Conjunctiva/sclera: Conjunctivae normal.  Pulmonary:     Effort: Pulmonary effort is normal.  Genitourinary:      Comments: Patient has approximately 2 cm area of fluctuance that is erythematous with no drainage present to right lower outer labia. Neurological:     General: No focal deficit present.     Mental Status: She is alert and oriented to person, place, and time. Mental status is at baseline.  Psychiatric:        Mood and Affect: Mood normal.        Behavior: Behavior normal.        Thought Content: Thought content normal.        Judgment: Judgment normal.      UC Treatments / Results  Labs (all labs ordered are listed, but only abnormal results are displayed) Labs Reviewed - No data to  display  EKG   Radiology No results found.  Procedures Procedures (including critical care time)  Medications Ordered in UC Medications - No data to display  Initial Impression / Assessment and Plan / UC Course  I have reviewed the triage vital signs and the nursing notes.  Pertinent labs & imaging results that were available during my care of the patient were reviewed by me and considered in my medical decision making (see chart for details).     Differential diagnosis include labial abscess versus Bartholin's gland abscess.  It is not super consistent with Bartholin's abscess but there is concern for this.  Given location, this is not safe to drain in urgent care but I do not think that emergent evaluation is necessary given it is a small abscess.  Will treat with Bactrim and patient advised of warm compresses.  Patient was given strict ER precautions if symptoms persist or worsen.  I have also placed an ambulatory referral to gynecology for further evaluation and management.  Patient given strict ER precautions. Patient verbalized understanding and was agreeable with plan. Final Clinical Impressions(s) / UC Diagnoses   Final diagnoses:  Labial abscess     Discharge Instructions      I have prescribed you an antibiotic for concern for abscess.  I do have a concern for Bartholin's gland abscess which needs to be evaluated by gynecology as soon as possible.  I have placed a referral for this.  If this worsens or persists over the weekend, please go to the emergency department for evaluation.    ED Prescriptions     Medication Sig Dispense Auth. Provider   sulfamethoxazole-trimethoprim (BACTRIM DS) 800-160 MG tablet Take 1 tablet by mouth 2 (two) times daily for 7 days. 14 tablet [place], Acie Fredrickson, [place]      PDMP not reviewed this encounter.   Gustavus Bryant, [place] 12/23/22 1822    Gustavus Bryant, [place] 12/23/22 [phone]

## 2022-12-23 NOTE — ED Triage Notes (Signed)
Pt states "I believe I have a boil in my vagina". Not painful. States has been there x 2 days

## 2022-12-25 ENCOUNTER — Other Ambulatory Visit: Payer: Self-pay

## 2022-12-25 ENCOUNTER — Emergency Department (HOSPITAL_COMMUNITY)
Admission: EM | Admit: 2022-12-25 | Discharge: 2022-12-25 | Disposition: A | Discharge status: Home / Self Care | Payer: Medicaid Other | Attending: Emergency Medicine | Admitting: Emergency Medicine

## 2022-12-25 ENCOUNTER — Encounter (HOSPITAL_COMMUNITY): Payer: Self-pay | Admitting: Pharmacy Technician

## 2022-12-25 DIAGNOSIS — N751 Abscess of Bartholin's gland: Secondary | ICD-10-CM | POA: Diagnosis present

## 2022-12-25 LAB — PREGNANCY, URINE: Preg Test, Ur: NEGATIVE

## 2022-12-25 MED ORDER — OXYCODONE HCL 5 MG PO TABS
5.0000 mg | ORAL_TABLET | Freq: Once | ORAL | Status: AC
  Administered 2022-12-25: 5 mg via ORAL
  Filled 2022-12-25: qty 1

## 2022-12-25 MED ORDER — LIDOCAINE-EPINEPHRINE (PF) 2 %-1:200000 IJ SOLN
10.0000 mL | Freq: Once | INTRAMUSCULAR | Status: AC
  Administered 2022-12-25: 10 mL via INTRADERMAL
  Filled 2022-12-25: qty 20

## 2022-12-25 MED ORDER — OXYCODONE HCL 5 MG PO TABS: 5.0000 mg | ORAL_TABLET | ORAL | 0 refills | Status: DC | PRN

## 2022-12-25 MED ORDER — LIDOCAINE-EPINEPHRINE-TETRACAINE (LET) TOPICAL GEL
3.0000 mL | Freq: Once | TOPICAL | Status: AC
  Administered 2022-12-25: 3 mL via TOPICAL
  Filled 2022-12-25: qty 3

## 2022-12-25 NOTE — ED Provider Notes (Signed)
  [place] EMERGENCY DEPARTMENT AT [facility] Provider Note   CSN: [id number] Arrival date & time: 12/25/22  1538     History {Add pertinent medical, surgical, social history, OB history to HPI:1} Chief Complaint  Patient presents with   Abscess    Sarah Randolph is a 32 y.o. female.  HPI     Home Medications Prior to Admission medications   Medication Sig Start Date End Date Taking? Authorizing Provider  sulfamethoxazole-trimethoprim (BACTRIM DS) 800-160 MG tablet Take 1 tablet by mouth 2 (two) times daily for 7 days. 12/23/22 12/30/22  Gustavus Bryant, FNP      Allergies    Patient has no known allergies.    Review of Systems   Review of Systems  Physical Exam Updated Vital Signs BP 124/89   Pulse 86   Temp 98.6 F (37 C)   Resp 17   LMP 12/04/2022   SpO2 100%  Physical Exam  ED Results / Procedures / Treatments   Labs (all labs ordered are listed, but only abnormal results are displayed) Labs Reviewed - No data to display  EKG None  Radiology No results found.  Procedures Procedures  {Document cardiac monitor, telemetry assessment procedure when appropriate:1}  Medications Ordered in ED Medications  lidocaine-EPINEPHrine (XYLOCAINE W/EPI) 2 %-1:200000 (PF) injection 10 mL (10 mLs Intradermal Given by Other 12/25/22 1640)  lidocaine-EPINEPHrine-tetracaine (LET) topical gel (3 mLs Topical Given by Other 12/25/22 1752)    ED Course/ Medical Decision Making/ A&P   {   Click here for ABCD2, HEART and other calculatorsREFRESH Note before signing :1}                              Medical Decision Making Amount and/or Complexity of Data Reviewed Labs: ordered.  Risk Prescription drug management.   ***  {Document critical care time when appropriate:1} {Document review of labs and clinical decision tools ie heart score, Chads2Vasc2 etc:1}  {Document your independent review of radiology images, and any outside  records:1} {Document your discussion with family members, caretakers, and with consultants:1} {Document social determinants of health affecting pt's care:1} {Document your decision making why or why not admission, treatments were needed:1} Final Clinical Impression(s) / ED Diagnoses Final diagnoses:  None    Rx / DC Orders ED Discharge Orders     None

## 2022-12-25 NOTE — ED Triage Notes (Signed)
Pt here with reports of having an abscess to the vaginal area. Seen several days ago at [facility] for same. States the abscess is getting bigger and more painful despite abx.

## 2022-12-25 NOTE — Discharge Instructions (Addendum)
Continue the antibiotics as prescribed, as well as sitz bathes.    For your pain, you may take up to 1000mg  of acetaminophen (tylenol) 4 times daily for up to a week. This is the maximum dose of acetminophen (tylenol) you can take from all sources. Please check other over-the-counter medications and prescriptions to ensure you are not taking other medications that contain acetaminophen.  You may also take ibuprofen 400 mg 6 times a day OR 600mg  4 times a day alternating with or at the same time as tylenol.  Take oxycodone as needed for breakthrough pain.  This medication can be addicting, sedating and cause constipation.

## 2022-12-26 LAB — HIV ANTIBODY (ROUTINE TESTING W REFLEX): HIV Screen 4th Generation wRfx: NONREACTIVE

## 2022-12-27 LAB — GC/CHLAMYDIA PROBE AMP (~~LOC~~) NOT AT ARMC
Chlamydia: NEGATIVE
Comment: NEGATIVE
Comment: NORMAL
Neisseria Gonorrhea: NEGATIVE

## 2022-12-28 ENCOUNTER — Other Ambulatory Visit: Payer: Self-pay

## 2022-12-28 ENCOUNTER — Ambulatory Visit: Payer: Medicaid Other | Admitting: Internal Medicine

## 2022-12-28 ENCOUNTER — Other Ambulatory Visit: Payer: Self-pay | Admitting: Internal Medicine

## 2022-12-28 ENCOUNTER — Telehealth: Payer: Self-pay | Admitting: Internal Medicine

## 2022-12-28 ENCOUNTER — Encounter: Payer: Self-pay | Admitting: Internal Medicine

## 2022-12-28 ENCOUNTER — Telehealth: Payer: Self-pay | Admitting: General Practice

## 2022-12-28 VITALS — BP 124/82 | HR 92 | Temp 98.0°F | Ht 74.0 in | Wt 146.0 lb

## 2022-12-28 DIAGNOSIS — D649 Anemia, unspecified: Secondary | ICD-10-CM | POA: Diagnosis not present

## 2022-12-28 DIAGNOSIS — N764 Abscess of vulva: Secondary | ICD-10-CM

## 2022-12-28 DIAGNOSIS — R739 Hyperglycemia, unspecified: Secondary | ICD-10-CM

## 2022-12-28 DIAGNOSIS — E7800 Pure hypercholesterolemia, unspecified: Secondary | ICD-10-CM

## 2022-12-28 DIAGNOSIS — E538 Deficiency of other specified B group vitamins: Secondary | ICD-10-CM | POA: Diagnosis not present

## 2022-12-28 DIAGNOSIS — E559 Vitamin D deficiency, unspecified: Secondary | ICD-10-CM

## 2022-12-28 LAB — HEPATIC FUNCTION PANEL
ALT: 13 U/L (ref 0–35)
AST: 25 U/L (ref 0–37)
Albumin: 4.7 g/dL (ref 3.5–5.2)
Alkaline Phosphatase: 46 U/L (ref 39–117)
Bilirubin, Direct: 0.1 mg/dL (ref 0.0–0.3)
Total Bilirubin: 0.5 mg/dL (ref 0.2–1.2)
Total Protein: 8.2 g/dL (ref 6.0–8.3)

## 2022-12-28 LAB — HEMOGLOBIN A1C: Hgb A1c MFr Bld: 5.6 % (ref 4.6–6.5)

## 2022-12-28 LAB — CBC WITH DIFFERENTIAL/PLATELET
Basophils Absolute: 0.1 10*3/uL (ref 0.0–0.1)
Basophils Relative: 1.1 % (ref 0.0–3.0)
Eosinophils Absolute: 0.2 10*3/uL (ref 0.0–0.7)
Eosinophils Relative: 2.1 % (ref 0.0–5.0)
HCT: 33.8 % — ABNORMAL LOW (ref 36.0–46.0)
Hemoglobin: 10.7 g/dL — ABNORMAL LOW (ref 12.0–15.0)
Lymphocytes Relative: 27.2 % (ref 12.0–46.0)
Lymphs Abs: 2 10*3/uL (ref 0.7–4.0)
MCHC: 31.6 g/dL (ref 30.0–36.0)
MCV: 72.9 fL — ABNORMAL LOW (ref 78.0–100.0)
Monocytes Absolute: 1 10*3/uL (ref 0.1–1.0)
Monocytes Relative: 13.4 % — ABNORMAL HIGH (ref 3.0–12.0)
Neutro Abs: 4.2 10*3/uL (ref 1.4–7.7)
Neutrophils Relative %: 56.2 % (ref 43.0–77.0)
Platelets: 183 10*3/uL (ref 150.0–400.0)
RBC: 4.64 Mil/uL (ref 3.87–5.11)
RDW: 19.7 % — ABNORMAL HIGH (ref 11.5–15.5)
WBC: 7.5 10*3/uL (ref 4.0–10.5)

## 2022-12-28 LAB — IBC PANEL
Iron: 25 ug/dL — ABNORMAL LOW (ref 42–145)
Saturation Ratios: 3.8 % — ABNORMAL LOW (ref 20.0–50.0)
TIBC: 660.8 ug/dL — ABNORMAL HIGH (ref 250.0–450.0)
Transferrin: 472 mg/dL — ABNORMAL HIGH (ref 212.0–360.0)

## 2022-12-28 LAB — LIPID PANEL
Cholesterol: 162 mg/dL (ref 0–200)
HDL: 61 mg/dL (ref >39.00)
LDL Cholesterol: 90 mg/dL (ref 0–99)
NonHDL: 100.84
Total CHOL/HDL Ratio: 3
Triglycerides: 54 mg/dL (ref 0.0–149.0)
VLDL: 10.8 mg/dL (ref 0.0–40.0)

## 2022-12-28 LAB — BASIC METABOLIC PANEL
BUN: 10 mg/dL (ref 6–23)
CO2: 24 meq/L (ref 19–32)
Calcium: 9.7 mg/dL (ref 8.4–10.5)
Chloride: 102 meq/L (ref 96–112)
Creatinine, Ser: 0.8 mg/dL (ref 0.40–1.20)
GFR: 97.08 mL/min (ref >60.00)
Glucose, Bld: 84 mg/dL (ref 70–99)
Potassium: 3.8 meq/L (ref 3.5–5.1)
Sodium: 133 meq/L — ABNORMAL LOW (ref 135–145)

## 2022-12-28 LAB — TSH: TSH: 2.38 u[IU]/mL (ref 0.35–5.50)

## 2022-12-28 LAB — VITAMIN B12: Vitamin B-12: 194 pg/mL — ABNORMAL LOW (ref 211–911)

## 2022-12-28 LAB — VITAMIN D 25 HYDROXY (VIT D DEFICIENCY, FRACTURES): VITD: 7.94 ng/mL — ABNORMAL LOW (ref 30.00–100.00)

## 2022-12-28 LAB — FERRITIN: Ferritin: 3.5 ng/mL — ABNORMAL LOW (ref 10.0–291.0)

## 2022-12-28 MED ORDER — POLYSACCHARIDE IRON COMPLEX 150 MG PO CAPS: 150.0000 mg | ORAL_CAPSULE | Freq: Every day | ORAL | 1 refills | Status: DC

## 2022-12-28 MED ORDER — NAPROXEN 500 MG PO TABS: 500.0000 mg | ORAL_TABLET | Freq: Two times a day (BID) | ORAL | 2 refills | Status: AC | PRN

## 2022-12-28 NOTE — Telephone Encounter (Signed)
Prescription Request  12/28/2022  LOV: 12/28/2022  What is the name of the medication or equipment? iron polysaccharides (NU-IRON) 150 MG capsule   Have you contacted your pharmacy to request a refill? No   Which pharmacy would you like this sent to?    [facility] - [place], [place] - [address] [place] [address] Phone: [phone] Fax: [phone]  Patient notified that their request is being sent to the clinical staff for review and that they should receive a response within 2 business days.   Please advise at Mobile [phone] (mobile)   Medication needs to be sent to this location.Marland KitchenMarland KitchenMedication sent to the wrong pharmacy.Marland KitchenMarland KitchenPlease advise.

## 2022-12-28 NOTE — Patient Instructions (Signed)
Please continue all other medications as before, including the antibiotic  Please take all new medication as prescribed  - the naproxen for pain  Please have the pharmacy call with any other refills you may need.  Please continue your efforts at being more active, low cholesterol diet, and weight control.  Please keep your appointments with your specialists as you may have planned  You will be contacted regarding the referral for: GYN - urgent  Please go to the LAB at the blood drawing area for the tests to be done  You will be contacted by phone if any changes need to be made immediately.  Otherwise, you will receive a letter about your results with an explanation, but please check with MyChart first.  Please make an Appointment to return in 6 - 12 months, or sooner if needed

## 2022-12-28 NOTE — Telephone Encounter (Signed)
Patient called into the office stating she was seen at the ER over the weekend and had an abscess cut and drained with a catheter put in place. Patient states she feels irritation and pulling on the opposite side of where she was cut. She reported continued difficulty sitting or standing for long periods of time. Feels pain or swelling hasn't improved since then and feels like the catheter is coming out too far. Offered appt for tomorrow at 315 & patient verbalized understanding.

## 2022-12-28 NOTE — Progress Notes (Unsigned)
   GYNECOLOGY OFFICE VISIT NOTE  History:   Sarah Randolph is a 32 y.o. No obstetric history on file. here today for Bartholin's abscess that underwent I&D on 11/10 with Word catheter placement. Prior to that she was seen in UC on 11/8 and was given Bactrim for 7 days.   ***    Past Medical History:  Diagnosis Date   Chronic constipation    DIZZINESS 06/11/2009   HEAD TRAUMA, MILD 06/10/2008   Hidradenitis    HSV (herpes simplex virus) infection     Past Surgical History:  Procedure Laterality Date   TONSILLECTOMY  08/2009   Dr. Edd Fabian    The following portions of the patient's history were reviewed and updated as appropriate: allergies, current medications, past family history, past medical history, past social history, past surgical history and problem list.  Review of Systems:  Pertinent items noted in HPI and remainder of comprehensive ROS otherwise negative.  Physical Exam:  LMP 12/04/2022  CONSTITUTIONAL: Well-developed, well-nourished female in no acute distress.  HEENT:  Normocephalic, atraumatic. External right and left ear normal. No scleral icterus.  NECK: Normal range of motion, supple, no masses noted on observation SKIN: No rash noted. Not diaphoretic. No erythema. No pallor. MUSCULOSKELETAL: Normal range of motion. No edema noted. NEUROLOGIC: Alert and oriented to person, place, and time. Normal muscle tone coordination. No cranial nerve deficit noted. PSYCHIATRIC: Normal mood and affect. Normal behavior. Normal judgment and thought content.  PELVIC: {Blank single:19197::"Deferred","Normal appearing external genitalia; normal urethral meatus; normal appearing vaginal mucosa and cervix.  No abnormal discharge noted.  Normal uterine size, no other palpable masses, no uterine or adnexal tenderness. Performed in the presence of a chaperone"}  Labs and Imaging Results for orders placed or performed during the hospital encounter of 12/25/22 (from the past  168 hour(s))  GC/Chlamydia probe amp ([place]) not at [facility]   Collection Time: 12/25/22  7:41 PM  Result Value Ref Range   Neisseria Gonorrhea Negative    Chlamydia Negative    Comment Normal Reference Ranger Chlamydia - Negative    Comment      Normal Reference Range Neisseria Gonorrhea - Negative  Pregnancy, urine   Collection Time: 12/25/22  8:00 PM  Result Value Ref Range   Preg Test, Ur NEGATIVE NEGATIVE  HIV Antibody (routine testing w rflx)   Collection Time: 12/25/22  8:04 PM  Result Value Ref Range   HIV Screen 4th Generation wRfx Non Reactive Non Reactive   No results found.  Assessment and Plan:   1. Bartholin's gland abscess ***    Diagnoses and all orders for this visit:  Bartholin's gland abscess     No orders of the defined types were placed in this encounter.    Routine preventative health maintenance measures emphasized. Please refer to After Visit Summary for other counseling recommendations.   No follow-ups on file.  Milas Hock, MD, FACOG Obstetrician & Gynecologist, [facility] for [facility], [facility]

## 2022-12-28 NOTE — Progress Notes (Unsigned)
Patient ID: Sarah Randolph, female   DOB: 1991/01/21, 32 y.o.   MRN: [id number]        Chief Complaint: follow up left vulvar abscess       HPI:  Sarah Randolph is a 32 y.o. female here s/p ED visit with left vulvar abscess with I&D and wick placed, pain persists 6/10 with small drainage but no fever, chills, further swelling, and good compliance with antibx.   Never picked up pain med due to sent to wrong pharmacy that was closed. Pt denies chest pain, increased sob or doe, wheezing, orthopnea, PND, increased LE swelling, palpitations, dizziness or syncope.   Pt denies polydipsia, polyuria, or new focal neuro s/s.   Needs GYN referral   Wt Readings from Last 3 Encounters:  12/29/22 145 lb 6.4 oz (66 kg)  12/28/22 146 lb (66.2 kg)  06/30/21 160 lb (72.6 kg)   BP Readings from Last 3 Encounters:  12/29/22 123/85  12/28/22 124/82  12/25/22 124/89         Past Medical History:  Diagnosis Date   Chronic constipation    DIZZINESS 06/11/2009   HEAD TRAUMA, MILD 06/10/2008   Hidradenitis    HSV (herpes simplex virus) infection    Past Surgical History:  Procedure Laterality Date   TONSILLECTOMY  08/2009   Dr. Edd Fabian    reports that she has been smoking cigars. She has never used smokeless tobacco. She reports current alcohol use. She reports that she does not use drugs. family history includes Hypertension in an other family member. No Known Allergies No current outpatient medications on file prior to visit.   No current facility-administered medications on file prior to visit.        ROS:  All others reviewed and negative.  Objective        PE:  BP 124/82   Pulse 92   Temp 98 F (36.7 C) (Oral)   Ht 6\' 2"  (1.88 m)   Wt 146 lb (66.2 kg)   LMP 12/04/2022   SpO2 99%   BMI 18.75 kg/m                 Constitutional: Pt appears in NAD               HENT: Head: NCAT.                Right Ear: External ear normal.                 Left Ear: External ear normal.                 Eyes: . Pupils are equal, round, and reactive to light. Conjunctivae and EOM are normal               Nose: without d/c or deformity               Neck: Neck supple. Gross normal ROM               Cardiovascular: Normal rate and regular rhythm.                 Pulmonary/Chest: Effort normal and breath sounds without rales or wheezing.                Abd:  Soft, NT, ND, + BS, no organomegaly               Neurological: Pt is alert. At baseline orientation, motor grossly intact  Skin: Skin is warm. No rashes, no other new lesions, LE edema - none               Psychiatric: Pt behavior is normal without agitation   Micro: none  Cardiac tracings I have personally interpreted today:  none  Pertinent Radiological findings (summarize): none   Lab Results  Component Value Date   WBC 7.5 12/28/2022   HGB 10.7 (L) 12/28/2022   HCT 33.8 (L) 12/28/2022   PLT 183.0 12/28/2022   GLUCOSE 84 12/28/2022   CHOL 162 12/28/2022   TRIG 54.0 12/28/2022   HDL 61.00 12/28/2022   LDLCALC 90 12/28/2022   ALT 13 12/28/2022   AST 25 12/28/2022   NA 133 (L) 12/28/2022   K 3.8 12/28/2022   CL 102 12/28/2022   CREATININE 0.80 12/28/2022   BUN 10 12/28/2022   CO2 24 12/28/2022   TSH 2.38 12/28/2022   HGBA1C 5.6 12/28/2022   Assessment/Plan:  Sarah Randolph is a 32 y.o. Black or African American [2] female with  has a past medical history of Chronic constipation, DIZZINESS (06/11/2009), HEAD TRAUMA, MILD (06/10/2008), Hidradenitis, and HSV (herpes simplex virus) infection.  Vitamin D deficiency Last vitamin D Lab Results  Component Value Date   VD25OH 7.94 (L) 12/28/2022   Low, to start oral replacement   Pure hypercholesterolemia Lab Results  Component Value Date   LDLCALC 90 12/28/2022   Stable, pt to cont diet, wt control  Left genital labial abscess Mild to mod, to continue antibx course, refer gYN  to f/u any worsening symptoms or  concerns  Hyperglycemia Lab Results  Component Value Date   HGBA1C 5.6 12/28/2022   Stable, pt to continue current medical treatment  - diet, wt control   B12 deficiency Lab Results  Component Value Date   VITAMINB12 194 (L) 12/28/2022   Low, to start oral replacement - b12 1000 mcg qd   Anemia Lab Results  Component Value Date   WBC 7.5 12/28/2022   HGB 10.7 (L) 12/28/2022   HCT 33.8 (L) 12/28/2022   MCV 72.9 (L) 12/28/2022   PLT 183.0 12/28/2022   With mild low iron - for iron supplement  Followup: Return in about 6 months (around 06/27/2023).  Oliver Barre, MD 12/29/2022 9:30 PM [facility] Internal Medicine

## 2022-12-28 NOTE — Telephone Encounter (Signed)
Refill sent.

## 2022-12-29 ENCOUNTER — Ambulatory Visit: Payer: Medicaid Other | Admitting: Obstetrics and Gynecology

## 2022-12-29 ENCOUNTER — Other Ambulatory Visit: Payer: Self-pay

## 2022-12-29 ENCOUNTER — Encounter: Payer: Self-pay | Admitting: Internal Medicine

## 2022-12-29 ENCOUNTER — Encounter: Payer: Self-pay | Admitting: Obstetrics and Gynecology

## 2022-12-29 VITALS — BP 123/85 | HR 116 | Ht 74.0 in | Wt 145.4 lb

## 2022-12-29 DIAGNOSIS — N764 Abscess of vulva: Secondary | ICD-10-CM | POA: Diagnosis not present

## 2022-12-29 DIAGNOSIS — N751 Abscess of Bartholin's gland: Secondary | ICD-10-CM

## 2022-12-29 MED ORDER — SULFAMETHOXAZOLE-TRIMETHOPRIM 800-160 MG PO TABS: 1.0000 | ORAL_TABLET | Freq: Two times a day (BID) | ORAL | 0 refills | Status: AC

## 2022-12-29 NOTE — Assessment & Plan Note (Signed)
Lab Results  Component Value Date   VITAMINB12 194 (L) 12/28/2022   Low, to start oral replacement - b12 1000 mcg qd

## 2022-12-29 NOTE — Assessment & Plan Note (Signed)
Mild to mod, to continue antibx course, refer gYN  to f/u any worsening symptoms or concerns

## 2022-12-29 NOTE — Assessment & Plan Note (Signed)
Lab Results  Component Value Date   LDLCALC 90 12/28/2022   Stable, pt to cont diet, wt control

## 2022-12-29 NOTE — Assessment & Plan Note (Signed)
Last vitamin D Lab Results  Component Value Date   VD25OH 7.94 (L) 12/28/2022   Low, to start oral replacement

## 2022-12-29 NOTE — Assessment & Plan Note (Signed)
Lab Results  Component Value Date   WBC 7.5 12/28/2022   HGB 10.7 (L) 12/28/2022   HCT 33.8 (L) 12/28/2022   MCV 72.9 (L) 12/28/2022   PLT 183.0 12/28/2022   With mild low iron - for iron supplement

## 2022-12-29 NOTE — Assessment & Plan Note (Signed)
Lab Results  Component Value Date   HGBA1C 5.6 12/28/2022   Stable, pt to continue current medical treatment  - diet, wt control

## 2023-01-02 ENCOUNTER — Encounter: Payer: Medicaid Other | Admitting: Obstetrics and Gynecology

## 2023-01-02 LAB — HERPES SIMPLEX VIRUS CULTURE

## 2023-01-06 ENCOUNTER — Telehealth: Payer: Self-pay | Admitting: Family Medicine

## 2023-01-06 NOTE — Telephone Encounter (Signed)
I called the patient to get her scheduled, no answer. Message left.

## 2023-01-11 ENCOUNTER — Encounter: Payer: Medicaid Other | Admitting: Internal Medicine

## 2023-02-01 ENCOUNTER — Ambulatory Visit (INDEPENDENT_AMBULATORY_CARE_PROVIDER_SITE_OTHER): Payer: Medicaid Other | Admitting: Internal Medicine

## 2023-02-01 VITALS — BP 126/84 | HR 85 | Temp 98.5°F | Ht 74.0 in | Wt 153.0 lb

## 2023-02-01 DIAGNOSIS — E7800 Pure hypercholesterolemia, unspecified: Secondary | ICD-10-CM

## 2023-02-01 DIAGNOSIS — E559 Vitamin D deficiency, unspecified: Secondary | ICD-10-CM | POA: Diagnosis not present

## 2023-02-01 DIAGNOSIS — Z0000 Encounter for general adult medical examination without abnormal findings: Secondary | ICD-10-CM | POA: Diagnosis not present

## 2023-02-01 DIAGNOSIS — R739 Hyperglycemia, unspecified: Secondary | ICD-10-CM

## 2023-02-01 DIAGNOSIS — E538 Deficiency of other specified B group vitamins: Secondary | ICD-10-CM

## 2023-02-01 DIAGNOSIS — Z0001 Encounter for general adult medical examination with abnormal findings: Secondary | ICD-10-CM

## 2023-02-01 DIAGNOSIS — D509 Iron deficiency anemia, unspecified: Secondary | ICD-10-CM

## 2023-02-01 DIAGNOSIS — F17200 Nicotine dependence, unspecified, uncomplicated: Secondary | ICD-10-CM

## 2023-02-01 NOTE — Progress Notes (Signed)
Patient ID: Sarah Randolph, female   DOB: 1990-06-02, 32 y.o.   MRN: [id number]         Chief Complaint:: wellness exam and low vit d, low b12, iron def anemia, hld,         HPI:  Sarah Randolph is a 32 y.o. female here for wellness exam; decliens covid bosoter and flu shot, to see GYN soon for pap, has appt jan 29 with GYN, o/w up to date.  Smoking cigar occasionally, not ready to quit       Also, Pt denies chest pain, increased sob or doe, wheezing, orthopnea, PND, increased LE swelling, palpitations, dizziness or syncope.   Pt denies polydipsia, polyuria, or new focal neuro s/s.    Pt denies fever, wt loss, night sweats, loss of appetite, or other constitutional symptoms  No overt blood loss or bruising, except for menses.    Wt Readings from Last 3 Encounters:  02/01/23 153 lb (69.4 kg)  12/29/22 145 lb 6.4 oz (66 kg)  12/28/22 146 lb (66.2 kg)   BP Readings from Last 3 Encounters:  02/01/23 126/84  12/29/22 123/85  12/28/22 124/82   Immunization History  Administered Date(s) Administered   Hpv-Unspecified 12/12/2007, 02/12/2008, 06/12/2008   Influenza Whole 12/12/2007   Influenza,inj,Quad PF,6+ Mos 01/16/2015, 12/17/2015   MMR 12/12/2007   Td 12/12/2007   Tdap 07/05/2018   Tetanus 05/08/2009   Health Maintenance Due  Topic Date Due   Cervical Cancer Screening (HPV/Pap Cotest)  Never done      Past Medical History:  Diagnosis Date   Chronic constipation    DIZZINESS 06/11/2009   HEAD TRAUMA, MILD 06/10/2008   Hidradenitis    HSV (herpes simplex virus) infection    Past Surgical History:  Procedure Laterality Date   TONSILLECTOMY  08/2009   Dr. Edd Fabian    reports that she has been smoking cigars. She has never used smokeless tobacco. She reports current alcohol use. She reports that she does not use drugs. family history includes Hypertension in an other family member. No Known Allergies Current Outpatient Medications on File Prior to Visit   Medication Sig Dispense Refill   naproxen (NAPROSYN) 500 MG tablet Take 1 tablet (500 mg total) by mouth 2 (two) times daily as needed. (Patient not taking: Reported on 02/01/2023) 60 tablet 2   No current facility-administered medications on file prior to visit.        ROS:  All others reviewed and negative.  Objective        PE:  BP 126/84 (BP Location: Left Arm, Patient Position: Sitting, Cuff Size: Normal)   Pulse 85   Temp 98.5 F (36.9 C) (Oral)   Ht 6\' 2"  (1.88 m)   Wt 153 lb (69.4 kg)   LMP 01/06/2023 (Exact Date)   SpO2 93%   BMI 19.64 kg/m                 Constitutional: Pt appears in NAD               HENT: Head: NCAT.                Right Ear: External ear normal.                 Left Ear: External ear normal.                Eyes: . Pupils are equal, round, and reactive to light. Conjunctivae and EOM are normal  Nose: without d/c or deformity               Neck: Neck supple. Gross normal ROM               Cardiovascular: Normal rate and regular rhythm.                 Pulmonary/Chest: Effort normal and breath sounds without rales or wheezing.                Abd:  Soft, NT, ND, + BS, no organomegaly               Neurological: Pt is alert. At baseline orientation, motor grossly intact               Skin: Skin is warm. No rashes, no other new lesions, LE edema - none               Psychiatric: Pt behavior is normal without agitation   Micro: none  Cardiac tracings I have personally interpreted today:  none  Pertinent Radiological findings (summarize): none   Lab Results  Component Value Date   WBC 7.5 12/28/2022   HGB 10.7 (L) 12/28/2022   HCT 33.8 (L) 12/28/2022   PLT 183.0 12/28/2022   GLUCOSE 84 12/28/2022   CHOL 162 12/28/2022   TRIG 54.0 12/28/2022   HDL 61.00 12/28/2022   LDLCALC 90 12/28/2022   ALT 13 12/28/2022   AST 25 12/28/2022   NA 133 (L) 12/28/2022   K 3.8 12/28/2022   CL 102 12/28/2022   CREATININE 0.80 12/28/2022   BUN  10 12/28/2022   CO2 24 12/28/2022   TSH 2.38 12/28/2022   HGBA1C 5.6 12/28/2022   Assessment/Plan:  Sarah Randolph is a 32 y.o. Black or African American [2] female with  has a past medical history of Chronic constipation, DIZZINESS (06/11/2009), HEAD TRAUMA, MILD (06/10/2008), Hidradenitis, and HSV (herpes simplex virus) infection.  Encounter for well adult exam with abnormal findings Age and sex appropriate education and counseling updated with regular exercise and diet Referrals for preventative services - has GYN appt soon Immunizations addressed - declines covid booster and flu shot Smoking counseling  - pt counsled to quit, pt not ready Evidence for depression or other mood disorder - none significant Most recent labs reviewed. I have personally reviewed and have noted: 1) the patient's medical and social history 2) The patient's current medications and supplements 3) The patient's height, weight, and BMI have been recorded in the chart   Vitamin D deficiency Last vitamin D Lab Results  Component Value Date   VD25OH 7.94 (L) 12/28/2022   Low, to start oral replacement   B12 deficiency Lab Results  Component Value Date   VITAMINB12 194 (L) 12/28/2022   Low, to start oral replacement - b12 1000 mcg qd   Hyperglycemia Lab Results  Component Value Date   HGBA1C 5.6 12/28/2022   Minor Stable, pt to continue current medical treatment  - diet, wt control  Pure hypercholesterolemia Lab Results  Component Value Date   LDLCALC 90 12/28/2022   Stable, pt to continue low chol diet   Iron deficiency anemia Pt to continue oral supplementation, for f/u lab in 3 mo  Smoker Pt counsled to quit, pt not ready  Followup: Return in about 1 year (around 02/01/2024).  Oliver Barre, MD 02/04/2023 12:09 PM [facility]

## 2023-02-01 NOTE — Patient Instructions (Signed)
Please take OTC Vitamin D3 at 2000 units per day, indefinitely, as well as the B12 OTC 1000 mcg per day for at least 6 months  Also continue the iron supplement as you are doing  Please continue all other medications as before, and refills have been done if requested.  Please have the pharmacy call with any other refills you may need.  Please continue your efforts at being more active, low cholesterol diet, and weight control.  You are otherwise up to date with prevention measures today.  Please keep your appointments with your specialists as you may have planned  Please return in 3 months (about March 2025) to the LAB only for repeat labs to make sure the vitamins and the anemia are improving  You will be contacted by phone if any changes need to be made immediately.  Otherwise, you will receive a letter about your results with an explanation, but please check with MyChart first.  Please make an Appointment to return for your 1 year visit, or sooner if needed

## 2023-02-04 ENCOUNTER — Encounter: Payer: Self-pay | Admitting: Internal Medicine

## 2023-02-04 DIAGNOSIS — F17200 Nicotine dependence, unspecified, uncomplicated: Secondary | ICD-10-CM | POA: Insufficient documentation

## 2023-02-04 DIAGNOSIS — D509 Iron deficiency anemia, unspecified: Secondary | ICD-10-CM | POA: Insufficient documentation

## 2023-02-04 NOTE — Assessment & Plan Note (Signed)
Age and sex appropriate education and counseling updated with regular exercise and diet Referrals for preventative services - has GYN appt soon Immunizations addressed - declines covid booster and flu shot Smoking counseling  - pt counsled to quit, pt not ready Evidence for depression or other mood disorder - none significant Most recent labs reviewed. I have personally reviewed and have noted: 1) the patient's medical and social history 2) The patient's current medications and supplements 3) The patient's height, weight, and BMI have been recorded in the chart

## 2023-02-04 NOTE — Assessment & Plan Note (Signed)
Pt to continue oral supplementation, for f/u lab in 3 mo

## 2023-02-04 NOTE — Assessment & Plan Note (Signed)
Lab Results  Component Value Date   LDLCALC 90 12/28/2022   Stable, pt to continue low chol diet

## 2023-02-04 NOTE — Assessment & Plan Note (Signed)
Lab Results  Component Value Date   HGBA1C 5.6 12/28/2022   Minor Stable, pt to continue current medical treatment  - diet, wt control

## 2023-02-04 NOTE — Assessment & Plan Note (Signed)
 Last vitamin D Lab Results  Component Value Date   VD25OH 7.94 (L) 12/28/2022   Low, to start oral replacement

## 2023-02-04 NOTE — Assessment & Plan Note (Signed)
 Lab Results  Component Value Date   VITAMINB12 194 (L) 12/28/2022   Low, to start oral replacement - b12 1000 mcg qd

## 2023-02-04 NOTE — Assessment & Plan Note (Signed)
Pt counsled to quit, pt not ready °

## 2023-03-15 ENCOUNTER — Ambulatory Visit (INDEPENDENT_AMBULATORY_CARE_PROVIDER_SITE_OTHER): Payer: Medicaid Other | Admitting: Obstetrics and Gynecology

## 2023-03-15 ENCOUNTER — Encounter: Payer: Self-pay | Admitting: Obstetrics and Gynecology

## 2023-03-15 ENCOUNTER — Other Ambulatory Visit (HOSPITAL_COMMUNITY)
Admission: RE | Admit: 2023-03-15 | Discharge: 2023-03-15 | Disposition: A | Discharge status: Home / Self Care | Payer: BC Managed Care – PPO | Source: Ambulatory Visit | Attending: Obstetrics and Gynecology | Admitting: Obstetrics and Gynecology

## 2023-03-15 VITALS — BP 124/79 | HR 73 | Ht 74.0 in | Wt 147.7 lb

## 2023-03-15 DIAGNOSIS — Z113 Encounter for screening for infections with a predominantly sexual mode of transmission: Secondary | ICD-10-CM | POA: Diagnosis not present

## 2023-03-15 DIAGNOSIS — Z124 Encounter for screening for malignant neoplasm of cervix: Secondary | ICD-10-CM

## 2023-03-15 DIAGNOSIS — Z0000 Encounter for general adult medical examination without abnormal findings: Secondary | ICD-10-CM | POA: Diagnosis not present

## 2023-03-15 NOTE — Progress Notes (Signed)
   ANNUAL EXAM Patient name: SERINITY WARE MRN [id number]  Date of birth: 1990/10/08 Chief Complaint:   Gynecologic Exam  History of Present Illness:   KOSHA JAQUITH is a 33 y.o. G0P0000 female being seen today for a routine annual exam.   Current concerns: None. Denies heavy periods. Denies painful cramps.   Current birth control: NA, same sex couple  No LMP recorded.  Last Pap/Pap History: None on file.    Health Maintenance Due  Topic Date Due   Pneumococcal Vaccine 17-56 Years old (1 of 2 - PCV) Never done   Cervical Cancer Screening (HPV/Pap Cotest)  Never done   COVID-19 Vaccine (1 - 2024-25 season) Never done    Review of Systems:   Pertinent items are noted in HPI Denies any headaches, blurred vision, fatigue, shortness of breath, chest pain, abdominal pain, abnormal vaginal discharge/itching/odor/irritation, problems with periods, bowel movements, urination, or intercourse unless otherwise stated above.  Pertinent History Reviewed:  Reviewed past medical,surgical, social and family history.  Reviewed problem list, medications and allergies. Physical Assessment:   Vitals:   03/15/23 0903  BP: (!) 123/92  Pulse: 76  Weight: 147 lb 11.2 oz (67 kg)  Height: 6\' 2"  (1.88 m)  Body mass index is 18.96 kg/m.   Physical Examination:  General appearance - well appearing, and in no distress Mental status - alert, oriented to person, place, and time Psych:  She has a normal mood and affect Skin - warm and dry, normal color, no suspicious lesions noted Chest - effort normal Heart - normal rate  Breasts - breasts appear normal, no suspicious masses, no skin or nipple changes or axillary nodes Abdomen - soft, nontender, nondistended, no masses or organomegaly Pelvic -  VULVA: normal appearing vulva with no masses, tenderness or lesions  VAGINA: normal appearing vagina with normal color and discharge, no lesions  CERVIX: normal appearing cervix without discharge  or lesions, no CMT UTERUS: uterus is felt to be normal size, shape, consistency and nontender  ADNEXA: No adnexal masses or tenderness noted. Extremities:  No swelling or varicosities noted  Chaperone present for exam  No results found for this or any previous visit (from the past 24 hours).  Assessment & Plan:  Amire was seen today for gynecologic exam.  Diagnoses and all orders for this visit:  Encounter for Papanicolaou smear for cervical cancer screening - Cervical cancer screening: Discussed guidelines. Pap with HPV done - GC/CT: accepts - Birth Control:  NA - Breast Health: Encouraged self breast awareness/SBE. Teaching provided.  - F/U 12 months and prn -     Cytology - PAP( [place])  Routine screening for STI (sexually transmitted infection) -     RPR; Future -     Hepatitis C Antibody -     Hepatitis B Surface AntiGEN      Orders Placed This Encounter  Procedures   RPR   Hepatitis C Antibody   Hepatitis B Surface AntiGEN    Meds: No orders of the defined types were placed in this encounter.   Follow-up: No follow-ups on file.  Milas Hock, MD 03/15/2023 9:27 AM

## 2023-03-16 LAB — HEPATITIS C ANTIBODY: Hep C Virus Ab: NONREACTIVE

## 2023-03-16 LAB — HEPATITIS B SURFACE ANTIGEN: Hepatitis B Surface Ag: NEGATIVE

## 2023-03-21 ENCOUNTER — Encounter: Payer: Self-pay | Admitting: Obstetrics and Gynecology

## 2023-03-21 LAB — CYTOLOGY - PAP
Adequacy: ABSENT
Chlamydia: NEGATIVE
Comment: NEGATIVE
Comment: NEGATIVE
Comment: NORMAL
Diagnosis: NEGATIVE
High risk HPV: NEGATIVE
Neisseria Gonorrhea: NEGATIVE

## 2023-07-24 ENCOUNTER — Other Ambulatory Visit: Payer: Self-pay | Admitting: Internal Medicine

## 2023-07-24 DIAGNOSIS — E7800 Pure hypercholesterolemia, unspecified: Secondary | ICD-10-CM

## 2023-07-24 DIAGNOSIS — E559 Vitamin D deficiency, unspecified: Secondary | ICD-10-CM

## 2023-07-24 DIAGNOSIS — E538 Deficiency of other specified B group vitamins: Secondary | ICD-10-CM

## 2023-07-24 DIAGNOSIS — R739 Hyperglycemia, unspecified: Secondary | ICD-10-CM

## 2023-07-24 DIAGNOSIS — D509 Iron deficiency anemia, unspecified: Secondary | ICD-10-CM
